# Patient Record
Sex: Female | Born: 1935 | Race: White | Hispanic: No | Smoking: Never smoker
Health system: Southern US, Community
[De-identification: ages and names within clinical notes are randomized; demographics above are authoritative.]

## PROBLEM LIST (undated history)

## (undated) DIAGNOSIS — I10 Essential (primary) hypertension: Secondary | ICD-10-CM

## (undated) DIAGNOSIS — E7800 Pure hypercholesterolemia, unspecified: Secondary | ICD-10-CM

## (undated) DIAGNOSIS — I4891 Unspecified atrial fibrillation: Secondary | ICD-10-CM

## (undated) DIAGNOSIS — R600 Localized edema: Secondary | ICD-10-CM

## (undated) DIAGNOSIS — F419 Anxiety disorder, unspecified: Secondary | ICD-10-CM

## (undated) DIAGNOSIS — R609 Edema, unspecified: Secondary | ICD-10-CM

## (undated) HISTORY — PX: CATARACT EXTRACTION W/ INTRAOCULAR LENS  IMPLANT, BILATERAL: SHX1307

## (undated) HISTORY — PX: ABDOMINAL HYSTERECTOMY: SHX81

## (undated) HISTORY — PX: CHOLECYSTECTOMY: SHX55

---

## 2002-01-28 ENCOUNTER — Encounter: Admission: RE | Admit: 2002-01-28 | Discharge: 2002-01-28 | Payer: Self-pay | Admitting: Family Medicine

## 2002-01-28 ENCOUNTER — Encounter: Payer: Self-pay | Admitting: Family Medicine

## 2003-03-21 ENCOUNTER — Ambulatory Visit (HOSPITAL_COMMUNITY): Admission: RE | Admit: 2003-03-21 | Discharge: 2003-03-21 | Payer: Self-pay | Admitting: Otolaryngology

## 2003-03-21 ENCOUNTER — Encounter: Payer: Self-pay | Admitting: Otolaryngology

## 2003-07-22 ENCOUNTER — Ambulatory Visit (HOSPITAL_COMMUNITY): Admission: RE | Admit: 2003-07-22 | Discharge: 2003-07-22 | Payer: Self-pay | Admitting: Gastroenterology

## 2003-07-22 ENCOUNTER — Encounter (INDEPENDENT_AMBULATORY_CARE_PROVIDER_SITE_OTHER): Payer: Self-pay | Admitting: Specialist

## 2003-09-09 ENCOUNTER — Ambulatory Visit (HOSPITAL_COMMUNITY): Admission: RE | Admit: 2003-09-09 | Discharge: 2003-09-09 | Payer: Self-pay | Admitting: Gastroenterology

## 2004-06-29 ENCOUNTER — Encounter: Admission: RE | Admit: 2004-06-29 | Discharge: 2004-06-29 | Payer: Self-pay | Admitting: Family Medicine

## 2007-12-04 ENCOUNTER — Encounter: Admission: RE | Admit: 2007-12-04 | Discharge: 2007-12-04 | Payer: Self-pay | Admitting: Family Medicine

## 2008-06-09 ENCOUNTER — Encounter: Admission: RE | Admit: 2008-06-09 | Discharge: 2008-06-09 | Payer: Self-pay | Admitting: Family Medicine

## 2010-11-24 NOTE — Op Note (Signed)
NAME:  Lynn Alvarado, Lynn Alvarado                          ACCOUNT NO.:  [id number]   MEDICAL RECORD NO.:  [id number]                   PATIENT TYPE:  AMB   LOCATION:  ENDO                                 FACILITY:  [facility]   PHYSICIAN:  Danise Edge, M.D.                DATE OF BIRTH:  1924-12-07   DATE OF PROCEDURE:  07/22/2003  DATE OF DISCHARGE:                                 OPERATIVE REPORT   PROCEDURE:  Incomplete colonoscopy.   PROCEDURE INDICATION:  [name] is a 77 year old female, born  July 29, 1924.  [name] was scheduled to undergo her first screening  colonoscopy with polyp to prevent colon cancer.  Due to extensive colonic  diverticulosis, sigmoid colonic loop formation, and the inability of Ms.  Krygier to retain the insufflated air, I failed to complete a colonoscopy  and, in fact, could only advance the endoscope to approximately 30 cm from  the anal verge.   ENDOSCOPIST:  Charolett Bumpers, M.D.   PREMEDICATION:  1. Demerol 50 mg.  2. Versed 3 mg.   DESCRIPTION OF PROCEDURE:  After obtaining informed consent, [name] was  placed in the left lateral decubitus position.  I administered intravenous  Demerol and intravenous Versed to achieve conscious sedation for the  procedure.  The patient's blood pressure, oxygen saturation, and cardiac  rhythm were monitored throughout the procedure and documented in the medical  record.   Anal inspection was normal.  Digital rectal exam was normal.  The Olympus  adjustable pediatric colonoscope was introduced into the rectum and advanced  to only 30 cm from the anal verge.  A complete colonoscopy could not be  performed due to colonic diverticulosis, colonic loop formation, and the  inability of [name] to retain the insufflated air, making  identification of a lumen impossible.  From the proximal rectum, a 2 mm  sessile polyp was removed with the cold biopsy forceps.  [name] does  have sigmoid colonic  diverticulosis.  The rectal mucosa and distal sigmoid  mucosa otherwise appeared normal.   ASSESSMENT:  1. Incomplete colonoscopy.  2. Left colonic diverticulosis.  3. A diminutive polyp was removed from the proximal rectum.   RECOMMENDATIONS:  I will schedule [name] for an air contrast barium  enema at [facility] in 3-6 weeks.                                               Danise Edge, M.D.    MJ/MEDQ  D:  07/22/2003  T:  07/22/2003  Job:  [id number]   cc:   Donia Guiles, M.D.  [address] [place]  [place] [address]  Fax: [phone]

## 2011-07-31 DIAGNOSIS — E782 Mixed hyperlipidemia: Secondary | ICD-10-CM | POA: Diagnosis not present

## 2011-07-31 DIAGNOSIS — R7301 Impaired fasting glucose: Secondary | ICD-10-CM | POA: Diagnosis not present

## 2011-07-31 DIAGNOSIS — E039 Hypothyroidism, unspecified: Secondary | ICD-10-CM | POA: Diagnosis not present

## 2011-07-31 DIAGNOSIS — F329 Major depressive disorder, single episode, unspecified: Secondary | ICD-10-CM | POA: Diagnosis not present

## 2011-07-31 DIAGNOSIS — I10 Essential (primary) hypertension: Secondary | ICD-10-CM | POA: Diagnosis not present

## 2011-07-31 DIAGNOSIS — E559 Vitamin D deficiency, unspecified: Secondary | ICD-10-CM | POA: Diagnosis not present

## 2011-07-31 DIAGNOSIS — R42 Dizziness and giddiness: Secondary | ICD-10-CM | POA: Diagnosis not present

## 2011-10-09 DIAGNOSIS — H353190 Nonexudative age-related macular degeneration, unspecified eye, stage unspecified: Secondary | ICD-10-CM | POA: Diagnosis not present

## 2011-10-09 DIAGNOSIS — H04129 Dry eye syndrome of unspecified lacrimal gland: Secondary | ICD-10-CM | POA: Diagnosis not present

## 2011-10-09 DIAGNOSIS — H35369 Drusen (degenerative) of macula, unspecified eye: Secondary | ICD-10-CM | POA: Diagnosis not present

## 2012-01-28 DIAGNOSIS — Z0000 Encounter for general adult medical examination without abnormal findings: Secondary | ICD-10-CM | POA: Diagnosis not present

## 2012-01-28 DIAGNOSIS — I10 Essential (primary) hypertension: Secondary | ICD-10-CM | POA: Diagnosis not present

## 2012-01-28 DIAGNOSIS — E559 Vitamin D deficiency, unspecified: Secondary | ICD-10-CM | POA: Diagnosis not present

## 2012-01-28 DIAGNOSIS — R7301 Impaired fasting glucose: Secondary | ICD-10-CM | POA: Diagnosis not present

## 2012-01-28 DIAGNOSIS — E782 Mixed hyperlipidemia: Secondary | ICD-10-CM | POA: Diagnosis not present

## 2012-01-28 DIAGNOSIS — F329 Major depressive disorder, single episode, unspecified: Secondary | ICD-10-CM | POA: Diagnosis not present

## 2012-01-28 DIAGNOSIS — R42 Dizziness and giddiness: Secondary | ICD-10-CM | POA: Diagnosis not present

## 2012-01-28 DIAGNOSIS — E039 Hypothyroidism, unspecified: Secondary | ICD-10-CM | POA: Diagnosis not present

## 2012-02-22 DIAGNOSIS — H3530 Unspecified macular degeneration: Secondary | ICD-10-CM | POA: Diagnosis not present

## 2012-02-22 DIAGNOSIS — H04129 Dry eye syndrome of unspecified lacrimal gland: Secondary | ICD-10-CM | POA: Diagnosis not present

## 2012-02-22 DIAGNOSIS — Z961 Presence of intraocular lens: Secondary | ICD-10-CM | POA: Diagnosis not present

## 2012-03-03 DIAGNOSIS — M899 Disorder of bone, unspecified: Secondary | ICD-10-CM | POA: Diagnosis not present

## 2012-04-15 DIAGNOSIS — H35369 Drusen (degenerative) of macula, unspecified eye: Secondary | ICD-10-CM | POA: Diagnosis not present

## 2012-04-15 DIAGNOSIS — H43819 Vitreous degeneration, unspecified eye: Secondary | ICD-10-CM | POA: Diagnosis not present

## 2012-04-15 DIAGNOSIS — H353190 Nonexudative age-related macular degeneration, unspecified eye, stage unspecified: Secondary | ICD-10-CM | POA: Diagnosis not present

## 2012-04-28 DIAGNOSIS — Z1231 Encounter for screening mammogram for malignant neoplasm of breast: Secondary | ICD-10-CM | POA: Diagnosis not present

## 2012-05-26 DIAGNOSIS — Z23 Encounter for immunization: Secondary | ICD-10-CM | POA: Diagnosis not present

## 2012-07-30 DIAGNOSIS — I10 Essential (primary) hypertension: Secondary | ICD-10-CM | POA: Diagnosis not present

## 2012-07-30 DIAGNOSIS — R42 Dizziness and giddiness: Secondary | ICD-10-CM | POA: Diagnosis not present

## 2012-07-30 DIAGNOSIS — E782 Mixed hyperlipidemia: Secondary | ICD-10-CM | POA: Diagnosis not present

## 2012-07-30 DIAGNOSIS — E039 Hypothyroidism, unspecified: Secondary | ICD-10-CM | POA: Diagnosis not present

## 2012-07-30 DIAGNOSIS — R7301 Impaired fasting glucose: Secondary | ICD-10-CM | POA: Diagnosis not present

## 2012-07-30 DIAGNOSIS — F329 Major depressive disorder, single episode, unspecified: Secondary | ICD-10-CM | POA: Diagnosis not present

## 2013-01-27 DIAGNOSIS — R42 Dizziness and giddiness: Secondary | ICD-10-CM | POA: Diagnosis not present

## 2013-01-27 DIAGNOSIS — E782 Mixed hyperlipidemia: Secondary | ICD-10-CM | POA: Diagnosis not present

## 2013-01-27 DIAGNOSIS — N318 Other neuromuscular dysfunction of bladder: Secondary | ICD-10-CM | POA: Diagnosis not present

## 2013-01-27 DIAGNOSIS — I10 Essential (primary) hypertension: Secondary | ICD-10-CM | POA: Diagnosis not present

## 2013-01-27 DIAGNOSIS — E039 Hypothyroidism, unspecified: Secondary | ICD-10-CM | POA: Diagnosis not present

## 2013-01-27 DIAGNOSIS — F329 Major depressive disorder, single episode, unspecified: Secondary | ICD-10-CM | POA: Diagnosis not present

## 2013-01-27 DIAGNOSIS — R7301 Impaired fasting glucose: Secondary | ICD-10-CM | POA: Diagnosis not present

## 2013-02-02 DIAGNOSIS — H43819 Vitreous degeneration, unspecified eye: Secondary | ICD-10-CM | POA: Diagnosis not present

## 2013-02-02 DIAGNOSIS — H35369 Drusen (degenerative) of macula, unspecified eye: Secondary | ICD-10-CM | POA: Diagnosis not present

## 2013-02-02 DIAGNOSIS — H353190 Nonexudative age-related macular degeneration, unspecified eye, stage unspecified: Secondary | ICD-10-CM | POA: Diagnosis not present

## 2013-02-02 DIAGNOSIS — T1500XA Foreign body in cornea, unspecified eye, initial encounter: Secondary | ICD-10-CM | POA: Diagnosis not present

## 2013-04-15 DIAGNOSIS — Z23 Encounter for immunization: Secondary | ICD-10-CM | POA: Diagnosis not present

## 2013-04-29 DIAGNOSIS — Z1231 Encounter for screening mammogram for malignant neoplasm of breast: Secondary | ICD-10-CM | POA: Diagnosis not present

## 2013-07-17 DIAGNOSIS — M26609 Unspecified temporomandibular joint disorder, unspecified side: Secondary | ICD-10-CM | POA: Diagnosis not present

## 2013-07-17 DIAGNOSIS — J069 Acute upper respiratory infection, unspecified: Secondary | ICD-10-CM | POA: Diagnosis not present

## 2013-08-04 ENCOUNTER — Ambulatory Visit
Admission: RE | Admit: 2013-08-04 | Discharge: 2013-08-04 | Disposition: A | Discharge status: Home / Self Care | Payer: Medicare Other | Source: Ambulatory Visit | Attending: Family Medicine | Admitting: Family Medicine

## 2013-08-04 ENCOUNTER — Other Ambulatory Visit: Payer: Self-pay | Admitting: Family Medicine

## 2013-08-04 DIAGNOSIS — E039 Hypothyroidism, unspecified: Secondary | ICD-10-CM | POA: Diagnosis not present

## 2013-08-04 DIAGNOSIS — R42 Dizziness and giddiness: Secondary | ICD-10-CM | POA: Diagnosis not present

## 2013-08-04 DIAGNOSIS — R042 Hemoptysis: Secondary | ICD-10-CM | POA: Diagnosis not present

## 2013-08-04 DIAGNOSIS — Z23 Encounter for immunization: Secondary | ICD-10-CM | POA: Diagnosis not present

## 2013-08-04 DIAGNOSIS — R059 Cough, unspecified: Secondary | ICD-10-CM | POA: Diagnosis not present

## 2013-08-04 DIAGNOSIS — N318 Other neuromuscular dysfunction of bladder: Secondary | ICD-10-CM | POA: Diagnosis not present

## 2013-08-04 DIAGNOSIS — R7301 Impaired fasting glucose: Secondary | ICD-10-CM | POA: Diagnosis not present

## 2013-08-04 DIAGNOSIS — R05 Cough: Secondary | ICD-10-CM | POA: Diagnosis not present

## 2013-08-04 DIAGNOSIS — I10 Essential (primary) hypertension: Secondary | ICD-10-CM | POA: Diagnosis not present

## 2013-08-04 DIAGNOSIS — F329 Major depressive disorder, single episode, unspecified: Secondary | ICD-10-CM | POA: Diagnosis not present

## 2013-08-04 DIAGNOSIS — E782 Mixed hyperlipidemia: Secondary | ICD-10-CM | POA: Diagnosis not present

## 2014-01-29 DIAGNOSIS — R42 Dizziness and giddiness: Secondary | ICD-10-CM | POA: Diagnosis not present

## 2014-01-29 DIAGNOSIS — E669 Obesity, unspecified: Secondary | ICD-10-CM | POA: Diagnosis not present

## 2014-01-29 DIAGNOSIS — E039 Hypothyroidism, unspecified: Secondary | ICD-10-CM | POA: Diagnosis not present

## 2014-01-29 DIAGNOSIS — F329 Major depressive disorder, single episode, unspecified: Secondary | ICD-10-CM | POA: Diagnosis not present

## 2014-01-29 DIAGNOSIS — R7301 Impaired fasting glucose: Secondary | ICD-10-CM | POA: Diagnosis not present

## 2014-01-29 DIAGNOSIS — E782 Mixed hyperlipidemia: Secondary | ICD-10-CM | POA: Diagnosis not present

## 2014-01-29 DIAGNOSIS — Z6832 Body mass index (BMI) 32.0-32.9, adult: Secondary | ICD-10-CM | POA: Diagnosis not present

## 2014-01-29 DIAGNOSIS — I10 Essential (primary) hypertension: Secondary | ICD-10-CM | POA: Diagnosis not present

## 2014-02-01 DIAGNOSIS — H35369 Drusen (degenerative) of macula, unspecified eye: Secondary | ICD-10-CM | POA: Diagnosis not present

## 2014-02-01 DIAGNOSIS — H353190 Nonexudative age-related macular degeneration, unspecified eye, stage unspecified: Secondary | ICD-10-CM | POA: Diagnosis not present

## 2014-02-01 DIAGNOSIS — H43819 Vitreous degeneration, unspecified eye: Secondary | ICD-10-CM | POA: Diagnosis not present

## 2014-02-23 DIAGNOSIS — Z961 Presence of intraocular lens: Secondary | ICD-10-CM | POA: Diagnosis not present

## 2014-02-23 DIAGNOSIS — H353190 Nonexudative age-related macular degeneration, unspecified eye, stage unspecified: Secondary | ICD-10-CM | POA: Diagnosis not present

## 2014-02-23 DIAGNOSIS — H04129 Dry eye syndrome of unspecified lacrimal gland: Secondary | ICD-10-CM | POA: Diagnosis not present

## 2014-04-30 DIAGNOSIS — Z1231 Encounter for screening mammogram for malignant neoplasm of breast: Secondary | ICD-10-CM | POA: Diagnosis not present

## 2014-08-03 DIAGNOSIS — Z0000 Encounter for general adult medical examination without abnormal findings: Secondary | ICD-10-CM | POA: Diagnosis not present

## 2014-08-03 DIAGNOSIS — I10 Essential (primary) hypertension: Secondary | ICD-10-CM | POA: Diagnosis not present

## 2014-08-03 DIAGNOSIS — R42 Dizziness and giddiness: Secondary | ICD-10-CM | POA: Diagnosis not present

## 2014-08-03 DIAGNOSIS — E039 Hypothyroidism, unspecified: Secondary | ICD-10-CM | POA: Diagnosis not present

## 2014-08-03 DIAGNOSIS — E782 Mixed hyperlipidemia: Secondary | ICD-10-CM | POA: Diagnosis not present

## 2014-08-03 DIAGNOSIS — E559 Vitamin D deficiency, unspecified: Secondary | ICD-10-CM | POA: Diagnosis not present

## 2014-08-03 DIAGNOSIS — M150 Primary generalized (osteo)arthritis: Secondary | ICD-10-CM | POA: Diagnosis not present

## 2014-08-03 DIAGNOSIS — N3281 Overactive bladder: Secondary | ICD-10-CM | POA: Diagnosis not present

## 2014-08-03 DIAGNOSIS — F322 Major depressive disorder, single episode, severe without psychotic features: Secondary | ICD-10-CM | POA: Diagnosis not present

## 2014-08-03 DIAGNOSIS — R7301 Impaired fasting glucose: Secondary | ICD-10-CM | POA: Diagnosis not present

## 2014-08-03 DIAGNOSIS — F411 Generalized anxiety disorder: Secondary | ICD-10-CM | POA: Diagnosis not present

## 2014-08-03 DIAGNOSIS — J301 Allergic rhinitis due to pollen: Secondary | ICD-10-CM | POA: Diagnosis not present

## 2014-08-04 DIAGNOSIS — M799 Soft tissue disorder, unspecified: Secondary | ICD-10-CM | POA: Diagnosis not present

## 2014-08-04 DIAGNOSIS — I10 Essential (primary) hypertension: Secondary | ICD-10-CM | POA: Diagnosis not present

## 2014-08-11 DIAGNOSIS — M25561 Pain in right knee: Secondary | ICD-10-CM | POA: Diagnosis not present

## 2014-08-18 DIAGNOSIS — S83206D Unspecified tear of unspecified meniscus, current injury, right knee, subsequent encounter: Secondary | ICD-10-CM | POA: Diagnosis not present

## 2014-08-18 DIAGNOSIS — M1711 Unilateral primary osteoarthritis, right knee: Secondary | ICD-10-CM | POA: Diagnosis not present

## 2014-09-06 DIAGNOSIS — M8580 Other specified disorders of bone density and structure, unspecified site: Secondary | ICD-10-CM | POA: Diagnosis not present

## 2014-09-06 DIAGNOSIS — M859 Disorder of bone density and structure, unspecified: Secondary | ICD-10-CM | POA: Diagnosis not present

## 2014-11-17 DIAGNOSIS — Z23 Encounter for immunization: Secondary | ICD-10-CM | POA: Diagnosis not present

## 2015-02-04 DIAGNOSIS — R7301 Impaired fasting glucose: Secondary | ICD-10-CM | POA: Diagnosis not present

## 2015-02-04 DIAGNOSIS — I10 Essential (primary) hypertension: Secondary | ICD-10-CM | POA: Diagnosis not present

## 2015-02-04 DIAGNOSIS — R42 Dizziness and giddiness: Secondary | ICD-10-CM | POA: Diagnosis not present

## 2015-02-04 DIAGNOSIS — E782 Mixed hyperlipidemia: Secondary | ICD-10-CM | POA: Diagnosis not present

## 2015-02-04 DIAGNOSIS — E039 Hypothyroidism, unspecified: Secondary | ICD-10-CM | POA: Diagnosis not present

## 2015-02-04 DIAGNOSIS — F322 Major depressive disorder, single episode, severe without psychotic features: Secondary | ICD-10-CM | POA: Diagnosis not present

## 2015-03-01 DIAGNOSIS — Z961 Presence of intraocular lens: Secondary | ICD-10-CM | POA: Diagnosis not present

## 2015-03-01 DIAGNOSIS — H04123 Dry eye syndrome of bilateral lacrimal glands: Secondary | ICD-10-CM | POA: Diagnosis not present

## 2015-03-01 DIAGNOSIS — H3531 Nonexudative age-related macular degeneration: Secondary | ICD-10-CM | POA: Diagnosis not present

## 2015-05-06 DIAGNOSIS — Z23 Encounter for immunization: Secondary | ICD-10-CM | POA: Diagnosis not present

## 2015-09-12 DIAGNOSIS — E559 Vitamin D deficiency, unspecified: Secondary | ICD-10-CM | POA: Diagnosis not present

## 2015-09-12 DIAGNOSIS — E782 Mixed hyperlipidemia: Secondary | ICD-10-CM | POA: Diagnosis not present

## 2015-09-12 DIAGNOSIS — Z0000 Encounter for general adult medical examination without abnormal findings: Secondary | ICD-10-CM | POA: Diagnosis not present

## 2015-09-12 DIAGNOSIS — M8580 Other specified disorders of bone density and structure, unspecified site: Secondary | ICD-10-CM | POA: Diagnosis not present

## 2015-09-12 DIAGNOSIS — R42 Dizziness and giddiness: Secondary | ICD-10-CM | POA: Diagnosis not present

## 2015-09-12 DIAGNOSIS — M150 Primary generalized (osteo)arthritis: Secondary | ICD-10-CM | POA: Diagnosis not present

## 2015-09-12 DIAGNOSIS — F411 Generalized anxiety disorder: Secondary | ICD-10-CM | POA: Diagnosis not present

## 2015-09-12 DIAGNOSIS — I10 Essential (primary) hypertension: Secondary | ICD-10-CM | POA: Diagnosis not present

## 2015-09-12 DIAGNOSIS — E039 Hypothyroidism, unspecified: Secondary | ICD-10-CM | POA: Diagnosis not present

## 2015-09-12 DIAGNOSIS — N3281 Overactive bladder: Secondary | ICD-10-CM | POA: Diagnosis not present

## 2015-09-12 DIAGNOSIS — R7301 Impaired fasting glucose: Secondary | ICD-10-CM | POA: Diagnosis not present

## 2015-09-12 DIAGNOSIS — F322 Major depressive disorder, single episode, severe without psychotic features: Secondary | ICD-10-CM | POA: Diagnosis not present

## 2016-02-02 DIAGNOSIS — H04129 Dry eye syndrome of unspecified lacrimal gland: Secondary | ICD-10-CM | POA: Diagnosis not present

## 2016-02-02 DIAGNOSIS — H35363 Drusen (degenerative) of macula, bilateral: Secondary | ICD-10-CM | POA: Diagnosis not present

## 2016-02-02 DIAGNOSIS — H43813 Vitreous degeneration, bilateral: Secondary | ICD-10-CM | POA: Diagnosis not present

## 2016-02-02 DIAGNOSIS — H353131 Nonexudative age-related macular degeneration, bilateral, early dry stage: Secondary | ICD-10-CM | POA: Diagnosis not present

## 2016-03-28 DIAGNOSIS — Z23 Encounter for immunization: Secondary | ICD-10-CM | POA: Diagnosis not present

## 2016-04-13 DIAGNOSIS — F322 Major depressive disorder, single episode, severe without psychotic features: Secondary | ICD-10-CM | POA: Diagnosis not present

## 2016-04-13 DIAGNOSIS — I10 Essential (primary) hypertension: Secondary | ICD-10-CM | POA: Diagnosis not present

## 2016-04-13 DIAGNOSIS — R7301 Impaired fasting glucose: Secondary | ICD-10-CM | POA: Diagnosis not present

## 2016-04-13 DIAGNOSIS — F411 Generalized anxiety disorder: Secondary | ICD-10-CM | POA: Diagnosis not present

## 2016-04-13 DIAGNOSIS — E039 Hypothyroidism, unspecified: Secondary | ICD-10-CM | POA: Diagnosis not present

## 2016-04-13 DIAGNOSIS — E782 Mixed hyperlipidemia: Secondary | ICD-10-CM | POA: Diagnosis not present

## 2016-04-13 DIAGNOSIS — N3281 Overactive bladder: Secondary | ICD-10-CM | POA: Diagnosis not present

## 2016-06-07 ENCOUNTER — Other Ambulatory Visit: Payer: Self-pay | Admitting: Family Medicine

## 2016-06-07 ENCOUNTER — Ambulatory Visit
Admission: RE | Admit: 2016-06-07 | Discharge: 2016-06-07 | Disposition: A | Discharge status: Home / Self Care | Payer: Medicare Other | Source: Ambulatory Visit | Attending: Family Medicine | Admitting: Family Medicine

## 2016-06-07 DIAGNOSIS — R042 Hemoptysis: Secondary | ICD-10-CM | POA: Diagnosis not present

## 2016-11-08 DIAGNOSIS — N3281 Overactive bladder: Secondary | ICD-10-CM | POA: Diagnosis not present

## 2016-11-08 DIAGNOSIS — F411 Generalized anxiety disorder: Secondary | ICD-10-CM | POA: Diagnosis not present

## 2016-11-08 DIAGNOSIS — M8580 Other specified disorders of bone density and structure, unspecified site: Secondary | ICD-10-CM | POA: Diagnosis not present

## 2016-11-08 DIAGNOSIS — E039 Hypothyroidism, unspecified: Secondary | ICD-10-CM | POA: Diagnosis not present

## 2016-11-08 DIAGNOSIS — R7301 Impaired fasting glucose: Secondary | ICD-10-CM | POA: Diagnosis not present

## 2016-11-08 DIAGNOSIS — E782 Mixed hyperlipidemia: Secondary | ICD-10-CM | POA: Diagnosis not present

## 2016-11-08 DIAGNOSIS — Z0000 Encounter for general adult medical examination without abnormal findings: Secondary | ICD-10-CM | POA: Diagnosis not present

## 2016-11-08 DIAGNOSIS — I10 Essential (primary) hypertension: Secondary | ICD-10-CM | POA: Diagnosis not present

## 2016-11-08 DIAGNOSIS — F322 Major depressive disorder, single episode, severe without psychotic features: Secondary | ICD-10-CM | POA: Diagnosis not present

## 2016-11-08 DIAGNOSIS — M150 Primary generalized (osteo)arthritis: Secondary | ICD-10-CM | POA: Diagnosis not present

## 2016-11-08 DIAGNOSIS — E559 Vitamin D deficiency, unspecified: Secondary | ICD-10-CM | POA: Diagnosis not present

## 2016-11-08 DIAGNOSIS — J301 Allergic rhinitis due to pollen: Secondary | ICD-10-CM | POA: Diagnosis not present

## 2016-12-19 DIAGNOSIS — M8588 Other specified disorders of bone density and structure, other site: Secondary | ICD-10-CM | POA: Diagnosis not present

## 2017-03-15 DIAGNOSIS — H353131 Nonexudative age-related macular degeneration, bilateral, early dry stage: Secondary | ICD-10-CM | POA: Diagnosis not present

## 2017-04-08 DIAGNOSIS — Z23 Encounter for immunization: Secondary | ICD-10-CM | POA: Diagnosis not present

## 2017-05-27 DIAGNOSIS — E039 Hypothyroidism, unspecified: Secondary | ICD-10-CM | POA: Diagnosis not present

## 2017-05-27 DIAGNOSIS — F322 Major depressive disorder, single episode, severe without psychotic features: Secondary | ICD-10-CM | POA: Diagnosis not present

## 2017-05-27 DIAGNOSIS — R7301 Impaired fasting glucose: Secondary | ICD-10-CM | POA: Diagnosis not present

## 2017-05-27 DIAGNOSIS — I10 Essential (primary) hypertension: Secondary | ICD-10-CM | POA: Diagnosis not present

## 2017-05-27 DIAGNOSIS — E782 Mixed hyperlipidemia: Secondary | ICD-10-CM | POA: Diagnosis not present

## 2017-12-04 DIAGNOSIS — E039 Hypothyroidism, unspecified: Secondary | ICD-10-CM | POA: Diagnosis not present

## 2017-12-04 DIAGNOSIS — M8580 Other specified disorders of bone density and structure, unspecified site: Secondary | ICD-10-CM | POA: Diagnosis not present

## 2017-12-04 DIAGNOSIS — F411 Generalized anxiety disorder: Secondary | ICD-10-CM | POA: Diagnosis not present

## 2017-12-04 DIAGNOSIS — M150 Primary generalized (osteo)arthritis: Secondary | ICD-10-CM | POA: Diagnosis not present

## 2017-12-04 DIAGNOSIS — R7301 Impaired fasting glucose: Secondary | ICD-10-CM | POA: Diagnosis not present

## 2017-12-04 DIAGNOSIS — N3281 Overactive bladder: Secondary | ICD-10-CM | POA: Diagnosis not present

## 2017-12-04 DIAGNOSIS — J301 Allergic rhinitis due to pollen: Secondary | ICD-10-CM | POA: Diagnosis not present

## 2017-12-04 DIAGNOSIS — Z0000 Encounter for general adult medical examination without abnormal findings: Secondary | ICD-10-CM | POA: Diagnosis not present

## 2017-12-04 DIAGNOSIS — F322 Major depressive disorder, single episode, severe without psychotic features: Secondary | ICD-10-CM | POA: Diagnosis not present

## 2017-12-04 DIAGNOSIS — E559 Vitamin D deficiency, unspecified: Secondary | ICD-10-CM | POA: Diagnosis not present

## 2017-12-04 DIAGNOSIS — I10 Essential (primary) hypertension: Secondary | ICD-10-CM | POA: Diagnosis not present

## 2017-12-04 DIAGNOSIS — E782 Mixed hyperlipidemia: Secondary | ICD-10-CM | POA: Diagnosis not present

## 2018-01-31 DIAGNOSIS — E559 Vitamin D deficiency, unspecified: Secondary | ICD-10-CM | POA: Diagnosis not present

## 2018-03-20 ENCOUNTER — Emergency Department (HOSPITAL_COMMUNITY): Payer: Medicare Other

## 2018-03-20 ENCOUNTER — Encounter (HOSPITAL_COMMUNITY): Payer: Self-pay

## 2018-03-20 ENCOUNTER — Other Ambulatory Visit: Payer: Self-pay

## 2018-03-20 ENCOUNTER — Observation Stay (HOSPITAL_COMMUNITY)
Admission: EM | Admit: 2018-03-20 | Discharge: 2018-03-21 | Disposition: A | Discharge status: Home / Self Care | Payer: Medicare Other | Attending: Internal Medicine | Admitting: Internal Medicine

## 2018-03-20 DIAGNOSIS — S8002XA Contusion of left knee, initial encounter: Secondary | ICD-10-CM | POA: Diagnosis not present

## 2018-03-20 DIAGNOSIS — I10 Essential (primary) hypertension: Secondary | ICD-10-CM | POA: Diagnosis not present

## 2018-03-20 DIAGNOSIS — I700 Atherosclerosis of aorta: Secondary | ICD-10-CM | POA: Insufficient documentation

## 2018-03-20 DIAGNOSIS — S27892A Contusion of other specified intrathoracic organs, initial encounter: Secondary | ICD-10-CM

## 2018-03-20 DIAGNOSIS — K838 Other specified diseases of biliary tract: Secondary | ICD-10-CM | POA: Diagnosis not present

## 2018-03-20 DIAGNOSIS — S2220XA Unspecified fracture of sternum, initial encounter for closed fracture: Secondary | ICD-10-CM

## 2018-03-20 DIAGNOSIS — Z9049 Acquired absence of other specified parts of digestive tract: Secondary | ICD-10-CM | POA: Insufficient documentation

## 2018-03-20 DIAGNOSIS — Z79890 Hormone replacement therapy (postmenopausal): Secondary | ICD-10-CM | POA: Insufficient documentation

## 2018-03-20 DIAGNOSIS — I491 Atrial premature depolarization: Secondary | ICD-10-CM | POA: Diagnosis not present

## 2018-03-20 DIAGNOSIS — D329 Benign neoplasm of meninges, unspecified: Secondary | ICD-10-CM | POA: Insufficient documentation

## 2018-03-20 DIAGNOSIS — D696 Thrombocytopenia, unspecified: Secondary | ICD-10-CM | POA: Diagnosis not present

## 2018-03-20 DIAGNOSIS — I480 Paroxysmal atrial fibrillation: Principal | ICD-10-CM | POA: Insufficient documentation

## 2018-03-20 DIAGNOSIS — J90 Pleural effusion, not elsewhere classified: Secondary | ICD-10-CM | POA: Diagnosis not present

## 2018-03-20 DIAGNOSIS — S8991XA Unspecified injury of right lower leg, initial encounter: Secondary | ICD-10-CM | POA: Diagnosis not present

## 2018-03-20 DIAGNOSIS — S199XXA Unspecified injury of neck, initial encounter: Secondary | ICD-10-CM | POA: Diagnosis not present

## 2018-03-20 DIAGNOSIS — E039 Hypothyroidism, unspecified: Secondary | ICD-10-CM | POA: Diagnosis present

## 2018-03-20 DIAGNOSIS — Z8249 Family history of ischemic heart disease and other diseases of the circulatory system: Secondary | ICD-10-CM | POA: Insufficient documentation

## 2018-03-20 DIAGNOSIS — Z79899 Other long term (current) drug therapy: Secondary | ICD-10-CM | POA: Diagnosis not present

## 2018-03-20 DIAGNOSIS — Z7982 Long term (current) use of aspirin: Secondary | ICD-10-CM | POA: Insufficient documentation

## 2018-03-20 DIAGNOSIS — K5730 Diverticulosis of large intestine without perforation or abscess without bleeding: Secondary | ICD-10-CM | POA: Insufficient documentation

## 2018-03-20 DIAGNOSIS — I4891 Unspecified atrial fibrillation: Secondary | ICD-10-CM | POA: Diagnosis not present

## 2018-03-20 DIAGNOSIS — F419 Anxiety disorder, unspecified: Secondary | ICD-10-CM | POA: Diagnosis not present

## 2018-03-20 DIAGNOSIS — Z23 Encounter for immunization: Secondary | ICD-10-CM | POA: Diagnosis not present

## 2018-03-20 DIAGNOSIS — M11262 Other chondrocalcinosis, left knee: Secondary | ICD-10-CM | POA: Insufficient documentation

## 2018-03-20 DIAGNOSIS — S0990XA Unspecified injury of head, initial encounter: Secondary | ICD-10-CM | POA: Diagnosis not present

## 2018-03-20 DIAGNOSIS — R0789 Other chest pain: Secondary | ICD-10-CM | POA: Diagnosis not present

## 2018-03-20 DIAGNOSIS — I499 Cardiac arrhythmia, unspecified: Secondary | ICD-10-CM | POA: Diagnosis not present

## 2018-03-20 DIAGNOSIS — S3991XA Unspecified injury of abdomen, initial encounter: Secondary | ICD-10-CM | POA: Diagnosis not present

## 2018-03-20 DIAGNOSIS — J9811 Atelectasis: Secondary | ICD-10-CM | POA: Insufficient documentation

## 2018-03-20 DIAGNOSIS — M79662 Pain in left lower leg: Secondary | ICD-10-CM | POA: Diagnosis not present

## 2018-03-20 DIAGNOSIS — S8992XA Unspecified injury of left lower leg, initial encounter: Secondary | ICD-10-CM | POA: Diagnosis not present

## 2018-03-20 DIAGNOSIS — G319 Degenerative disease of nervous system, unspecified: Secondary | ICD-10-CM | POA: Diagnosis not present

## 2018-03-20 DIAGNOSIS — I6782 Cerebral ischemia: Secondary | ICD-10-CM | POA: Insufficient documentation

## 2018-03-20 DIAGNOSIS — E041 Nontoxic single thyroid nodule: Secondary | ICD-10-CM | POA: Insufficient documentation

## 2018-03-20 DIAGNOSIS — S299XXA Unspecified injury of thorax, initial encounter: Secondary | ICD-10-CM | POA: Diagnosis not present

## 2018-03-20 DIAGNOSIS — R079 Chest pain, unspecified: Secondary | ICD-10-CM | POA: Diagnosis not present

## 2018-03-20 DIAGNOSIS — S20212A Contusion of left front wall of thorax, initial encounter: Secondary | ICD-10-CM | POA: Diagnosis not present

## 2018-03-20 DIAGNOSIS — M25562 Pain in left knee: Secondary | ICD-10-CM | POA: Diagnosis not present

## 2018-03-20 HISTORY — DX: Essential (primary) hypertension: I10

## 2018-03-20 HISTORY — DX: Localized edema: R60.0

## 2018-03-20 HISTORY — DX: Unspecified atrial fibrillation: I48.91

## 2018-03-20 HISTORY — DX: Edema, unspecified: R60.9

## 2018-03-20 HISTORY — DX: Pure hypercholesterolemia, unspecified: E78.00

## 2018-03-20 HISTORY — DX: Anxiety disorder, unspecified: F41.9

## 2018-03-20 LAB — COMPREHENSIVE METABOLIC PANEL
ALK PHOS: 81 U/L (ref 38–126)
ALK PHOS: 91 U/L (ref 38–126)
ALT: 30 U/L (ref 0–44)
ALT: 30 U/L (ref 0–44)
AST: 40 U/L (ref 15–41)
AST: 42 U/L — ABNORMAL HIGH (ref 15–41)
Albumin: 3.4 g/dL — ABNORMAL LOW (ref 3.5–5.0)
Albumin: 3.6 g/dL (ref 3.5–5.0)
Anion gap: 11 (ref 5–15)
Anion gap: 7 (ref 5–15)
BILIRUBIN TOTAL: 2.4 mg/dL — AB (ref 0.3–1.2)
BILIRUBIN TOTAL: 2.4 mg/dL — AB (ref 0.3–1.2)
BUN: 22 mg/dL (ref 8–23)
BUN: 22 mg/dL (ref 8–23)
CALCIUM: 8.7 mg/dL — AB (ref 8.9–10.3)
CALCIUM: 9.4 mg/dL (ref 8.9–10.3)
CHLORIDE: 103 mmol/L (ref 98–111)
CO2: 25 mmol/L (ref 22–32)
CO2: 30 mmol/L (ref 22–32)
CREATININE: 0.81 mg/dL (ref 0.44–1.00)
CREATININE: 0.81 mg/dL (ref 0.44–1.00)
Chloride: 104 mmol/L (ref 98–111)
GFR calc Af Amer: >60 mL/min (ref >60)
GFR calc Af Amer: >60 mL/min (ref >60)
GFR calc non Af Amer: >60 mL/min (ref >60)
GLUCOSE: 142 mg/dL — AB (ref 70–99)
Glucose, Bld: 149 mg/dL — ABNORMAL HIGH (ref 70–99)
Potassium: 4 mmol/L (ref 3.5–5.1)
Potassium: 4.2 mmol/L (ref 3.5–5.1)
Sodium: 140 mmol/L (ref 135–145)
Sodium: 140 mmol/L (ref 135–145)
TOTAL PROTEIN: 5.9 g/dL — AB (ref 6.5–8.1)
Total Protein: 6.6 g/dL (ref 6.5–8.1)

## 2018-03-20 LAB — CBC WITH DIFFERENTIAL/PLATELET
Abs Immature Granulocytes: 0.1 10*3/uL (ref 0.0–0.1)
BASOS PCT: 0 %
Basophils Absolute: 0 10*3/uL (ref 0.0–0.1)
EOS ABS: 0 10*3/uL (ref 0.0–0.7)
EOS PCT: 0 %
HCT: 48.6 % — ABNORMAL HIGH (ref 36.0–46.0)
Hemoglobin: 15.9 g/dL — ABNORMAL HIGH (ref 12.0–15.0)
IMMATURE GRANULOCYTES: 1 %
Lymphocytes Relative: 10 %
Lymphs Abs: 1.3 10*3/uL (ref 0.7–4.0)
MCH: 31.7 pg (ref 26.0–34.0)
MCHC: 32.7 g/dL (ref 30.0–36.0)
MCV: 97 fL (ref 78.0–100.0)
MONOS PCT: 12 %
Monocytes Absolute: 1.6 10*3/uL — ABNORMAL HIGH (ref 0.1–1.0)
Neutro Abs: 9.7 10*3/uL — ABNORMAL HIGH (ref 1.7–7.7)
Neutrophils Relative %: 77 %
PLATELETS: 125 10*3/uL — AB (ref 150–400)
RBC: 5.01 MIL/uL (ref 3.87–5.11)
RDW: 13.2 % (ref 11.5–15.5)
WBC: 12.6 10*3/uL — AB (ref 4.0–10.5)

## 2018-03-20 LAB — LIPASE, BLOOD
Lipase: 27 U/L (ref 11–51)
Lipase: 27 U/L (ref 11–51)

## 2018-03-20 LAB — TYPE AND SCREEN
ABO/RH(D): A POS
Antibody Screen: NEGATIVE

## 2018-03-20 LAB — ABO/RH: ABO/RH(D): A POS

## 2018-03-20 LAB — TROPONIN I: Troponin I: <0.03 ng/mL (ref <0.03)

## 2018-03-20 MED ORDER — LORAZEPAM 0.5 MG PO TABS: 0.5000 mg | ORAL_TABLET | Freq: Two times a day (BID) | ORAL | Status: DC | PRN

## 2018-03-20 MED ORDER — IOPAMIDOL (ISOVUE-370) INJECTION 76%
100.0000 mL | Freq: Once | INTRAVENOUS | Status: AC | PRN
  Administered 2018-03-20: 100 mL via INTRAVENOUS

## 2018-03-20 MED ORDER — OXYBUTYNIN CHLORIDE ER 5 MG PO TB24
5.0000 mg | ORAL_TABLET | Freq: Every day | ORAL | Status: DC
  Administered 2018-03-21: 5 mg via ORAL
  Filled 2018-03-20: qty 1

## 2018-03-20 MED ORDER — ONDANSETRON HCL 4 MG PO TABS: 4.0000 mg | ORAL_TABLET | Freq: Four times a day (QID) | ORAL | Status: DC | PRN

## 2018-03-20 MED ORDER — ACETAMINOPHEN 650 MG RE SUPP: 650.0000 mg | Freq: Four times a day (QID) | RECTAL | Status: DC | PRN

## 2018-03-20 MED ORDER — METOPROLOL TARTRATE 5 MG/5ML IV SOLN
2.5000 mg | Freq: Once | INTRAVENOUS | Status: AC
  Administered 2018-03-20: 2.5 mg via INTRAVENOUS
  Filled 2018-03-20: qty 5

## 2018-03-20 MED ORDER — IOPAMIDOL (ISOVUE-370) INJECTION 76%
INTRAVENOUS | Status: AC
  Filled 2018-03-20: qty 100

## 2018-03-20 MED ORDER — MORPHINE SULFATE (PF) 2 MG/ML IV SOLN
2.0000 mg | Freq: Once | INTRAVENOUS | Status: AC
  Administered 2018-03-20: 2 mg via INTRAVENOUS
  Filled 2018-03-20: qty 1

## 2018-03-20 MED ORDER — FENTANYL CITRATE (PF) 100 MCG/2ML IJ SOLN
25.0000 ug | INTRAMUSCULAR | Status: DC | PRN
  Administered 2018-03-21: 25 ug via INTRAVENOUS
  Filled 2018-03-20: qty 2

## 2018-03-20 MED ORDER — PRAVASTATIN SODIUM 40 MG PO TABS: 40.0000 mg | ORAL_TABLET | Freq: Every day | ORAL | Status: DC

## 2018-03-20 MED ORDER — ONDANSETRON HCL 4 MG/2ML IJ SOLN: 4.0000 mg | Freq: Four times a day (QID) | INTRAMUSCULAR | Status: DC | PRN

## 2018-03-20 MED ORDER — FENTANYL CITRATE (PF) 100 MCG/2ML IJ SOLN
25.0000 ug | Freq: Once | INTRAMUSCULAR | Status: AC
  Administered 2018-03-20: 25 ug via INTRAVENOUS
  Filled 2018-03-20: qty 2

## 2018-03-20 MED ORDER — INFLUENZA VAC SPLIT HIGH-DOSE 0.5 ML IM SUSY
0.5000 mL | PREFILLED_SYRINGE | INTRAMUSCULAR | Status: AC
  Administered 2018-03-21: 0.5 mL via INTRAMUSCULAR
  Filled 2018-03-20: qty 0.5

## 2018-03-20 MED ORDER — LEVOTHYROXINE SODIUM 25 MCG PO TABS
25.0000 ug | ORAL_TABLET | Freq: Every day | ORAL | Status: DC
  Administered 2018-03-21: 25 ug via ORAL
  Filled 2018-03-20: qty 1

## 2018-03-20 MED ORDER — SODIUM CHLORIDE 0.9 % IV BOLUS
1000.0000 mL | Freq: Once | INTRAVENOUS | Status: AC
  Administered 2018-03-20: 1000 mL via INTRAVENOUS

## 2018-03-20 MED ORDER — ACETAMINOPHEN 325 MG PO TABS: 650.0000 mg | ORAL_TABLET | Freq: Four times a day (QID) | ORAL | Status: DC | PRN

## 2018-03-20 MED ORDER — FENTANYL CITRATE (PF) 100 MCG/2ML IJ SOLN
12.5000 ug | Freq: Once | INTRAMUSCULAR | Status: AC
  Administered 2018-03-20: 12.5 ug via INTRAVENOUS
  Filled 2018-03-20: qty 2

## 2018-03-20 MED ORDER — BISOPROLOL-HYDROCHLOROTHIAZIDE 5-6.25 MG PO TABS
1.0000 | ORAL_TABLET | Freq: Every day | ORAL | Status: DC
  Administered 2018-03-21: 1 via ORAL
  Filled 2018-03-20: qty 1

## 2018-03-20 NOTE — ED Notes (Signed)
ED Provider at bedside. 
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
## 2018-03-20 NOTE — ED Provider Notes (Signed)
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 initial encounter  Mediastinal hematoma, initial encounter    ED Discharge Orders    None       Duffy Bruce, MD 03/20/18 1856

## 2018-03-20 NOTE — ED Notes (Signed)
Patient continues to be in CT.  Checked in on daughter and offered food/drink.  Refused.  Updated on delays
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
## 2018-03-20 NOTE — ED Notes (Signed)
EDP at bedside  

## 2018-03-20 NOTE — Plan of Care (Signed)
  Problem: Activity: Goal: Risk for activity intolerance will decrease Outcome: Progressing   Problem: Elimination: Goal: Will not experience complications related to bowel motility Outcome: Progressing   Problem: Safety: Goal: Ability to remain free from injury will improve Outcome: Progressing   

## 2018-03-20 NOTE — ED Notes (Signed)
Attempted report x1. 

## 2018-03-20 NOTE — ED Notes (Signed)
Pt transported to radiology.

## 2018-03-20 NOTE — ED Notes (Signed)
Light green hemolyzed, per lab.  Will recollect

## 2018-03-21 ENCOUNTER — Observation Stay (HOSPITAL_COMMUNITY): Payer: Medicare Other

## 2018-03-21 DIAGNOSIS — I480 Paroxysmal atrial fibrillation: Secondary | ICD-10-CM | POA: Diagnosis not present

## 2018-03-21 DIAGNOSIS — I4891 Unspecified atrial fibrillation: Secondary | ICD-10-CM | POA: Diagnosis not present

## 2018-03-21 DIAGNOSIS — S2220XA Unspecified fracture of sternum, initial encounter for closed fracture: Secondary | ICD-10-CM | POA: Diagnosis not present

## 2018-03-21 LAB — BASIC METABOLIC PANEL
ANION GAP: 13 (ref 5–15)
BUN: 20 mg/dL (ref 8–23)
CALCIUM: 9 mg/dL (ref 8.9–10.3)
CHLORIDE: 101 mmol/L (ref 98–111)
CO2: 25 mmol/L (ref 22–32)
Creatinine, Ser: 0.72 mg/dL (ref 0.44–1.00)
GFR calc Af Amer: >60 mL/min (ref >60)
GFR calc non Af Amer: >60 mL/min (ref >60)
Glucose, Bld: 118 mg/dL — ABNORMAL HIGH (ref 70–99)
POTASSIUM: 4 mmol/L (ref 3.5–5.1)
Sodium: 139 mmol/L (ref 135–145)

## 2018-03-21 LAB — CBC
HEMATOCRIT: 45.9 % (ref 36.0–46.0)
HEMOGLOBIN: 15 g/dL (ref 12.0–15.0)
MCH: 31.7 pg (ref 26.0–34.0)
MCHC: 32.7 g/dL (ref 30.0–36.0)
MCV: 97 fL (ref 78.0–100.0)
Platelets: 130 10*3/uL — ABNORMAL LOW (ref 150–400)
RBC: 4.73 MIL/uL (ref 3.87–5.11)
RDW: 13.4 % (ref 11.5–15.5)
WBC: 9 10*3/uL (ref 4.0–10.5)

## 2018-03-21 LAB — TSH: TSH: 1.441 u[IU]/mL (ref 0.350–4.500)

## 2018-03-21 LAB — TROPONIN I
Troponin I: <0.03 ng/mL (ref <0.03)
Troponin I: <0.03 ng/mL (ref <0.03)

## 2018-03-21 MED ORDER — ACETAMINOPHEN 325 MG PO TABS: 650.0000 mg | ORAL_TABLET | Freq: Four times a day (QID) | ORAL | 0 refills | Status: DC | PRN
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
## 2018-03-21 NOTE — Progress Notes (Signed)
Patient was discharged home. Patient denied chest pain, dizziness and shortness of breath. CCMD was called when tele was removed.   Transported with staff in the wheelchair.
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
## 2018-03-27 ENCOUNTER — Emergency Department (HOSPITAL_COMMUNITY)
Admission: EM | Admit: 2018-03-27 | Discharge: 2018-03-27 | Disposition: A | Discharge status: Home / Self Care | Payer: Medicare Other | Attending: Emergency Medicine | Admitting: Emergency Medicine

## 2018-03-27 ENCOUNTER — Emergency Department (HOSPITAL_COMMUNITY): Payer: Medicare Other

## 2018-03-27 ENCOUNTER — Encounter (HOSPITAL_COMMUNITY): Payer: Self-pay

## 2018-03-27 DIAGNOSIS — R410 Disorientation, unspecified: Secondary | ICD-10-CM | POA: Diagnosis not present

## 2018-03-27 DIAGNOSIS — I10 Essential (primary) hypertension: Secondary | ICD-10-CM | POA: Diagnosis not present

## 2018-03-27 DIAGNOSIS — Z7982 Long term (current) use of aspirin: Secondary | ICD-10-CM | POA: Diagnosis not present

## 2018-03-27 DIAGNOSIS — N390 Urinary tract infection, site not specified: Secondary | ICD-10-CM | POA: Insufficient documentation

## 2018-03-27 DIAGNOSIS — R296 Repeated falls: Secondary | ICD-10-CM | POA: Diagnosis not present

## 2018-03-27 DIAGNOSIS — R17 Unspecified jaundice: Secondary | ICD-10-CM | POA: Diagnosis not present

## 2018-03-27 DIAGNOSIS — R945 Abnormal results of liver function studies: Secondary | ICD-10-CM | POA: Insufficient documentation

## 2018-03-27 DIAGNOSIS — R0789 Other chest pain: Secondary | ICD-10-CM | POA: Diagnosis not present

## 2018-03-27 DIAGNOSIS — Z79899 Other long term (current) drug therapy: Secondary | ICD-10-CM | POA: Diagnosis not present

## 2018-03-27 DIAGNOSIS — R748 Abnormal levels of other serum enzymes: Secondary | ICD-10-CM | POA: Insufficient documentation

## 2018-03-27 DIAGNOSIS — I4891 Unspecified atrial fibrillation: Secondary | ICD-10-CM | POA: Insufficient documentation

## 2018-03-27 DIAGNOSIS — R7989 Other specified abnormal findings of blood chemistry: Secondary | ICD-10-CM

## 2018-03-27 DIAGNOSIS — S299XXA Unspecified injury of thorax, initial encounter: Secondary | ICD-10-CM | POA: Diagnosis not present

## 2018-03-27 DIAGNOSIS — K7689 Other specified diseases of liver: Secondary | ICD-10-CM | POA: Diagnosis not present

## 2018-03-27 DIAGNOSIS — S3991XA Unspecified injury of abdomen, initial encounter: Secondary | ICD-10-CM | POA: Diagnosis not present

## 2018-03-27 DIAGNOSIS — R0902 Hypoxemia: Secondary | ICD-10-CM | POA: Diagnosis not present

## 2018-03-27 DIAGNOSIS — S0990XA Unspecified injury of head, initial encounter: Secondary | ICD-10-CM | POA: Diagnosis not present

## 2018-03-27 DIAGNOSIS — F0390 Unspecified dementia without behavioral disturbance: Secondary | ICD-10-CM | POA: Diagnosis not present

## 2018-03-27 DIAGNOSIS — R4182 Altered mental status, unspecified: Secondary | ICD-10-CM | POA: Diagnosis present

## 2018-03-27 DIAGNOSIS — E039 Hypothyroidism, unspecified: Secondary | ICD-10-CM | POA: Insufficient documentation

## 2018-03-27 DIAGNOSIS — K838 Other specified diseases of biliary tract: Secondary | ICD-10-CM | POA: Diagnosis not present

## 2018-03-27 LAB — I-STAT CG4 LACTIC ACID, ED: Lactic Acid, Venous: 1.89 mmol/L (ref 0.5–1.9)

## 2018-03-27 LAB — URINALYSIS, ROUTINE W REFLEX MICROSCOPIC
BILIRUBIN URINE: NEGATIVE
Glucose, UA: NEGATIVE mg/dL
HGB URINE DIPSTICK: NEGATIVE
Ketones, ur: NEGATIVE mg/dL
LEUKOCYTES UA: NEGATIVE
NITRITE: POSITIVE — AB
PH: 7 (ref 5.0–8.0)
Protein, ur: NEGATIVE mg/dL
SPECIFIC GRAVITY, URINE: 1.018 (ref 1.005–1.030)

## 2018-03-27 LAB — CBC WITH DIFFERENTIAL/PLATELET
Abs Immature Granulocytes: 0.1 10*3/uL (ref 0.0–0.1)
BASOS ABS: 0.1 10*3/uL (ref 0.0–0.1)
BASOS PCT: 1 %
EOS PCT: 0 %
Eosinophils Absolute: 0 10*3/uL (ref 0.0–0.7)
HCT: 45.9 % (ref 36.0–46.0)
Hemoglobin: 14.9 g/dL (ref 12.0–15.0)
Immature Granulocytes: 1 %
Lymphocytes Relative: 13 %
Lymphs Abs: 1 10*3/uL (ref 0.7–4.0)
MCH: 31.8 pg (ref 26.0–34.0)
MCHC: 32.5 g/dL (ref 30.0–36.0)
MCV: 97.9 fL (ref 78.0–100.0)
MONO ABS: 1.1 10*3/uL — AB (ref 0.1–1.0)
MONOS PCT: 15 %
Neutro Abs: 5.1 10*3/uL (ref 1.7–7.7)
Neutrophils Relative %: 70 %
Platelets: 129 10*3/uL — ABNORMAL LOW (ref 150–400)
RBC: 4.69 MIL/uL (ref 3.87–5.11)
RDW: 14.2 % (ref 11.5–15.5)
WBC: 7.3 10*3/uL (ref 4.0–10.5)

## 2018-03-27 LAB — COMPREHENSIVE METABOLIC PANEL
ALT: 159 U/L — AB (ref 0–44)
ANION GAP: 14 (ref 5–15)
AST: 230 U/L — ABNORMAL HIGH (ref 15–41)
Albumin: 2.9 g/dL — ABNORMAL LOW (ref 3.5–5.0)
Alkaline Phosphatase: 498 U/L — ABNORMAL HIGH (ref 38–126)
BUN: 16 mg/dL (ref 8–23)
CO2: 20 mmol/L — AB (ref 22–32)
CREATININE: 0.64 mg/dL (ref 0.44–1.00)
Calcium: 8.3 mg/dL — ABNORMAL LOW (ref 8.9–10.3)
Chloride: 104 mmol/L (ref 98–111)
Glucose, Bld: 151 mg/dL — ABNORMAL HIGH (ref 70–99)
Potassium: 4.5 mmol/L (ref 3.5–5.1)
SODIUM: 138 mmol/L (ref 135–145)
Total Bilirubin: 6.4 mg/dL — ABNORMAL HIGH (ref 0.3–1.2)
Total Protein: 6.2 g/dL — ABNORMAL LOW (ref 6.5–8.1)

## 2018-03-27 LAB — I-STAT TROPONIN, ED: Troponin i, poc: 0.04 ng/mL (ref 0.00–0.08)

## 2018-03-27 LAB — ACETAMINOPHEN LEVEL: Acetaminophen (Tylenol), Serum: <10 ug/mL — ABNORMAL LOW (ref 10–30)

## 2018-03-27 LAB — PROTIME-INR
INR: 1.17
PROTHROMBIN TIME: 14.8 s (ref 11.4–15.2)

## 2018-03-27 LAB — AMMONIA: AMMONIA: 25 umol/L (ref 9–35)

## 2018-03-27 MED ORDER — DILTIAZEM HCL 25 MG/5ML IV SOLN
10.0000 mg | Freq: Once | INTRAVENOUS | Status: AC
  Administered 2018-03-27: 10 mg via INTRAVENOUS
  Filled 2018-03-27: qty 5

## 2018-03-27 MED ORDER — CEPHALEXIN 500 MG PO CAPS: ORAL_CAPSULE | ORAL | 0 refills | Status: DC

## 2018-03-27 MED ORDER — SODIUM CHLORIDE 0.9 % IV SOLN
1000.0000 mL | Freq: Once | INTRAVENOUS | Status: AC
  Administered 2018-03-27: 1000 mL via INTRAVENOUS

## 2018-03-27 MED ORDER — SODIUM CHLORIDE 0.9 % IV SOLN
1.0000 g | Freq: Once | INTRAVENOUS | Status: AC
  Administered 2018-03-27: 1 g via INTRAVENOUS
  Filled 2018-03-27: qty 10

## 2018-03-27 MED ORDER — IOHEXOL 300 MG/ML  SOLN
100.0000 mL | Freq: Once | INTRAMUSCULAR | Status: AC | PRN
  Administered 2018-03-27: 100 mL via INTRAVENOUS

## 2018-03-27 NOTE — ED Notes (Signed)
Patient transported to Ultrasound 

## 2018-03-27 NOTE — Discharge Instructions (Addendum)
Avoid Tylenol and alcohol. Return to the emergency department immediately if you develop fever, abdominal pain, confusion, nausea, vomiting, loss of appetite, lethargy, severe back pain, headache or if the yellowing of your skin and eyes becomes significantly worse. Please take all of your antibiotics as directed.

## 2018-03-27 NOTE — ED Notes (Signed)
Patient transported to CT 

## 2018-03-27 NOTE — ED Notes (Signed)
Pt stable, ambulatory, states understanding of discharge instructions, family transporting home.
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
## 2018-03-30 LAB — URINE CULTURE: Culture: >=100000 — AB

## 2018-03-31 ENCOUNTER — Telehealth: Payer: Self-pay | Admitting: Emergency Medicine

## 2018-03-31 DIAGNOSIS — K838 Other specified diseases of biliary tract: Secondary | ICD-10-CM | POA: Diagnosis not present

## 2018-03-31 DIAGNOSIS — R945 Abnormal results of liver function studies: Secondary | ICD-10-CM | POA: Diagnosis not present

## 2018-03-31 NOTE — Telephone Encounter (Signed)
Post ED Visit - Positive Culture Follow-up  Culture report reviewed by antimicrobial stewardship pharmacist:  []  Elenor Quinones, Pharm.D. []  Heide Guile, Pharm.D., BCPS AQ-ID []  Parks Neptune, Pharm.D., BCPS []  Alycia Rossetti, Pharm.D., BCPS []  [place], Pharm.D., BCPS, AAHIVP []  Legrand Como, Pharm.D., BCPS, AAHIVP []  Salome Arnt, PharmD, BCPS []  Johnnette Gourd, PharmD, BCPS [x]  Hughes Better, PharmD, BCPS []  Leeroy Cha, PharmD  Positive urine culture Treated with cephalexin, organism sensitive to the same and no further patient follow-up is required at this time.  Hazle Nordmann 03/31/2018, 3:46 PM

## 2018-04-04 ENCOUNTER — Encounter: Payer: Self-pay | Admitting: Family Medicine

## 2018-04-04 DIAGNOSIS — F0781 Postconcussional syndrome: Secondary | ICD-10-CM | POA: Diagnosis not present

## 2018-04-04 DIAGNOSIS — R410 Disorientation, unspecified: Secondary | ICD-10-CM | POA: Diagnosis not present

## 2018-04-04 DIAGNOSIS — E039 Hypothyroidism, unspecified: Secondary | ICD-10-CM | POA: Diagnosis not present

## 2018-04-04 DIAGNOSIS — R945 Abnormal results of liver function studies: Secondary | ICD-10-CM | POA: Diagnosis not present

## 2018-04-04 DIAGNOSIS — G4700 Insomnia, unspecified: Secondary | ICD-10-CM | POA: Diagnosis not present

## 2018-04-04 DIAGNOSIS — S2220XA Unspecified fracture of sternum, initial encounter for closed fracture: Secondary | ICD-10-CM | POA: Diagnosis not present

## 2018-04-04 DIAGNOSIS — I4891 Unspecified atrial fibrillation: Secondary | ICD-10-CM | POA: Diagnosis not present

## 2018-04-04 DIAGNOSIS — I129 Hypertensive chronic kidney disease with stage 1 through stage 4 chronic kidney disease, or unspecified chronic kidney disease: Secondary | ICD-10-CM | POA: Diagnosis not present

## 2018-04-08 ENCOUNTER — Ambulatory Visit (HOSPITAL_COMMUNITY)
Admission: RE | Admit: 2018-04-08 | Discharge: 2018-04-08 | Disposition: A | Discharge status: Home / Self Care | Payer: Medicare Other | Source: Ambulatory Visit | Attending: Nurse Practitioner | Admitting: Nurse Practitioner

## 2018-04-08 VITALS — BP 152/72 | HR 100 | Ht 65.0 in | Wt 164.0 lb

## 2018-04-08 DIAGNOSIS — Z79899 Other long term (current) drug therapy: Secondary | ICD-10-CM | POA: Diagnosis not present

## 2018-04-08 DIAGNOSIS — E7800 Pure hypercholesterolemia, unspecified: Secondary | ICD-10-CM | POA: Diagnosis not present

## 2018-04-08 DIAGNOSIS — I4891 Unspecified atrial fibrillation: Secondary | ICD-10-CM | POA: Insufficient documentation

## 2018-04-08 DIAGNOSIS — F419 Anxiety disorder, unspecified: Secondary | ICD-10-CM | POA: Diagnosis not present

## 2018-04-08 DIAGNOSIS — I10 Essential (primary) hypertension: Secondary | ICD-10-CM | POA: Diagnosis not present

## 2018-04-08 DIAGNOSIS — Z7901 Long term (current) use of anticoagulants: Secondary | ICD-10-CM | POA: Diagnosis not present

## 2018-04-08 MED ORDER — APIXABAN 5 MG PO TABS: 5.0000 mg | ORAL_TABLET | Freq: Two times a day (BID) | ORAL | 0 refills | Status: DC

## 2018-04-08 MED ORDER — BISOPROLOL-HYDROCHLOROTHIAZIDE 10-6.25 MG PO TABS: 1.0000 | ORAL_TABLET | Freq: Every day | ORAL | 3 refills | Status: DC

## 2018-04-08 NOTE — Patient Instructions (Addendum)
Stop aspirin  Start Eliquis 5mg  twice a day when cleared by GI, will discuss further on f/u 10/7  Increase ziac to 10mg /6.25mg  once a day -- new prescription was sent
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
## 2018-04-14 ENCOUNTER — Encounter (HOSPITAL_COMMUNITY): Payer: Self-pay | Admitting: Nurse Practitioner

## 2018-04-14 ENCOUNTER — Ambulatory Visit (HOSPITAL_COMMUNITY)
Admission: RE | Admit: 2018-04-14 | Discharge: 2018-04-14 | Disposition: A | Discharge status: Home / Self Care | Payer: Medicare Other | Source: Ambulatory Visit | Attending: Nurse Practitioner | Admitting: Nurse Practitioner

## 2018-04-14 VITALS — BP 132/72 | HR 80 | Ht 65.0 in | Wt 164.0 lb

## 2018-04-14 DIAGNOSIS — Z9841 Cataract extraction status, right eye: Secondary | ICD-10-CM | POA: Diagnosis not present

## 2018-04-14 DIAGNOSIS — Z9049 Acquired absence of other specified parts of digestive tract: Secondary | ICD-10-CM | POA: Diagnosis not present

## 2018-04-14 DIAGNOSIS — Z90710 Acquired absence of both cervix and uterus: Secondary | ICD-10-CM | POA: Diagnosis not present

## 2018-04-14 DIAGNOSIS — Z9842 Cataract extraction status, left eye: Secondary | ICD-10-CM | POA: Insufficient documentation

## 2018-04-14 DIAGNOSIS — F419 Anxiety disorder, unspecified: Secondary | ICD-10-CM | POA: Insufficient documentation

## 2018-04-14 DIAGNOSIS — Z7901 Long term (current) use of anticoagulants: Secondary | ICD-10-CM | POA: Insufficient documentation

## 2018-04-14 DIAGNOSIS — Z8249 Family history of ischemic heart disease and other diseases of the circulatory system: Secondary | ICD-10-CM | POA: Insufficient documentation

## 2018-04-14 DIAGNOSIS — R945 Abnormal results of liver function studies: Secondary | ICD-10-CM | POA: Diagnosis not present

## 2018-04-14 DIAGNOSIS — I10 Essential (primary) hypertension: Secondary | ICD-10-CM | POA: Insufficient documentation

## 2018-04-14 DIAGNOSIS — Z79899 Other long term (current) drug therapy: Secondary | ICD-10-CM | POA: Diagnosis not present

## 2018-04-14 DIAGNOSIS — Z79890 Hormone replacement therapy (postmenopausal): Secondary | ICD-10-CM | POA: Diagnosis not present

## 2018-04-14 DIAGNOSIS — Z833 Family history of diabetes mellitus: Secondary | ICD-10-CM | POA: Diagnosis not present

## 2018-04-14 DIAGNOSIS — E7800 Pure hypercholesterolemia, unspecified: Secondary | ICD-10-CM | POA: Diagnosis not present

## 2018-04-14 DIAGNOSIS — I4891 Unspecified atrial fibrillation: Secondary | ICD-10-CM | POA: Diagnosis not present

## 2018-04-14 DIAGNOSIS — Z961 Presence of intraocular lens: Secondary | ICD-10-CM | POA: Diagnosis not present
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
## 2018-04-18 ENCOUNTER — Telehealth (HOSPITAL_COMMUNITY): Payer: Self-pay | Admitting: *Deleted

## 2018-04-18 NOTE — Telephone Encounter (Signed)
Calling regarding her blood thinner.  Daughter Katharine Look called stating she heard back from GI doctor and that he cleared her to start, however, she has her reservations and wants to know if she can be monitored instead.  Will refer to Roderic Palau for info on stroke risk and blood thinner.

## 2018-04-18 NOTE — Addendum Note (Signed)
Encounter addended by: Sherran Needs, NP on: 04/18/2018 2:51 PM  Actions taken: Sign clinical note

## 2018-04-23 NOTE — Addendum Note (Signed)
Encounter addended by: Sherran Needs, NP on: 04/23/2018 3:03 PM  Actions taken: Sign clinical note

## 2018-05-12 ENCOUNTER — Ambulatory Visit (HOSPITAL_COMMUNITY)
Admission: RE | Admit: 2018-05-12 | Discharge: 2018-05-12 | Disposition: A | Discharge status: Home / Self Care | Payer: Medicare Other | Source: Ambulatory Visit | Attending: Nurse Practitioner | Admitting: Nurse Practitioner

## 2018-05-12 ENCOUNTER — Encounter (HOSPITAL_COMMUNITY): Payer: Self-pay | Admitting: Nurse Practitioner

## 2018-05-12 VITALS — BP 148/62 | HR 68 | Ht 65.0 in | Wt 163.0 lb

## 2018-05-12 DIAGNOSIS — I4891 Unspecified atrial fibrillation: Secondary | ICD-10-CM | POA: Insufficient documentation

## 2018-05-12 DIAGNOSIS — Z961 Presence of intraocular lens: Secondary | ICD-10-CM | POA: Insufficient documentation

## 2018-05-12 DIAGNOSIS — Z79899 Other long term (current) drug therapy: Secondary | ICD-10-CM | POA: Insufficient documentation

## 2018-05-12 DIAGNOSIS — Z9049 Acquired absence of other specified parts of digestive tract: Secondary | ICD-10-CM | POA: Insufficient documentation

## 2018-05-12 DIAGNOSIS — Z9841 Cataract extraction status, right eye: Secondary | ICD-10-CM | POA: Diagnosis not present

## 2018-05-12 DIAGNOSIS — Z79890 Hormone replacement therapy (postmenopausal): Secondary | ICD-10-CM | POA: Diagnosis not present

## 2018-05-12 DIAGNOSIS — Z7901 Long term (current) use of anticoagulants: Secondary | ICD-10-CM | POA: Insufficient documentation

## 2018-05-12 DIAGNOSIS — I10 Essential (primary) hypertension: Secondary | ICD-10-CM | POA: Insufficient documentation

## 2018-05-12 DIAGNOSIS — Z8249 Family history of ischemic heart disease and other diseases of the circulatory system: Secondary | ICD-10-CM | POA: Diagnosis not present

## 2018-05-12 DIAGNOSIS — Z833 Family history of diabetes mellitus: Secondary | ICD-10-CM | POA: Insufficient documentation

## 2018-05-12 DIAGNOSIS — Z9842 Cataract extraction status, left eye: Secondary | ICD-10-CM | POA: Insufficient documentation

## 2018-05-12 DIAGNOSIS — Z90710 Acquired absence of both cervix and uterus: Secondary | ICD-10-CM | POA: Diagnosis not present

## 2018-05-12 DIAGNOSIS — E7800 Pure hypercholesterolemia, unspecified: Secondary | ICD-10-CM | POA: Diagnosis not present

## 2018-05-12 LAB — CBC
HCT: 48.8 % — ABNORMAL HIGH (ref 36.0–46.0)
HEMOGLOBIN: 15.6 g/dL — AB (ref 12.0–15.0)
MCH: 31.7 pg (ref 26.0–34.0)
MCHC: 32 g/dL (ref 30.0–36.0)
MCV: 99.2 fL (ref 80.0–100.0)
NRBC: 0 % (ref 0.0–0.2)
Platelets: 131 10*3/uL — ABNORMAL LOW (ref 150–400)
RBC: 4.92 MIL/uL (ref 3.87–5.11)
RDW: 13.2 % (ref 11.5–15.5)
WBC: 5.3 10*3/uL (ref 4.0–10.5)

## 2018-05-12 LAB — COMPREHENSIVE METABOLIC PANEL
ALK PHOS: 98 U/L (ref 38–126)
ALT: 22 U/L (ref 0–44)
AST: 30 U/L (ref 15–41)
Albumin: 3.5 g/dL (ref 3.5–5.0)
Anion gap: 6 (ref 5–15)
BUN: 23 mg/dL (ref 8–23)
CALCIUM: 9.4 mg/dL (ref 8.9–10.3)
CHLORIDE: 104 mmol/L (ref 98–111)
CO2: 30 mmol/L (ref 22–32)
CREATININE: 0.87 mg/dL (ref 0.44–1.00)
GFR, EST NON AFRICAN AMERICAN: 57 mL/min — AB (ref >60)
Glucose, Bld: 153 mg/dL — ABNORMAL HIGH (ref 70–99)
Potassium: 4 mmol/L (ref 3.5–5.1)
SODIUM: 140 mmol/L (ref 135–145)
Total Bilirubin: 2.2 mg/dL — ABNORMAL HIGH (ref 0.3–1.2)
Total Protein: 6.4 g/dL — ABNORMAL LOW (ref 6.5–8.1)
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
## 2018-05-19 ENCOUNTER — Other Ambulatory Visit (HOSPITAL_COMMUNITY): Payer: Self-pay | Admitting: Nurse Practitioner

## 2018-06-13 DIAGNOSIS — I4891 Unspecified atrial fibrillation: Secondary | ICD-10-CM | POA: Diagnosis not present

## 2018-06-13 DIAGNOSIS — F322 Major depressive disorder, single episode, severe without psychotic features: Secondary | ICD-10-CM | POA: Diagnosis not present

## 2018-06-13 DIAGNOSIS — R7301 Impaired fasting glucose: Secondary | ICD-10-CM | POA: Diagnosis not present

## 2018-06-13 DIAGNOSIS — E039 Hypothyroidism, unspecified: Secondary | ICD-10-CM | POA: Diagnosis not present

## 2018-06-13 DIAGNOSIS — N183 Chronic kidney disease, stage 3 (moderate): Secondary | ICD-10-CM | POA: Diagnosis not present

## 2018-06-13 DIAGNOSIS — R413 Other amnesia: Secondary | ICD-10-CM | POA: Diagnosis not present

## 2018-06-13 DIAGNOSIS — I129 Hypertensive chronic kidney disease with stage 1 through stage 4 chronic kidney disease, or unspecified chronic kidney disease: Secondary | ICD-10-CM | POA: Diagnosis not present

## 2018-06-13 DIAGNOSIS — E782 Mixed hyperlipidemia: Secondary | ICD-10-CM | POA: Diagnosis not present

## 2018-06-19 ENCOUNTER — Other Ambulatory Visit (HOSPITAL_COMMUNITY): Payer: Self-pay | Admitting: *Deleted

## 2018-06-19 MED ORDER — BISOPROLOL-HYDROCHLOROTHIAZIDE 10-6.25 MG PO TABS: 1.0000 | ORAL_TABLET | Freq: Every day | ORAL | 2 refills | Status: DC

## 2018-12-09 ENCOUNTER — Other Ambulatory Visit (HOSPITAL_COMMUNITY): Payer: Self-pay | Admitting: Nurse Practitioner

## 2019-02-27 DIAGNOSIS — I10 Essential (primary) hypertension: Secondary | ICD-10-CM | POA: Diagnosis not present

## 2019-02-27 DIAGNOSIS — E782 Mixed hyperlipidemia: Secondary | ICD-10-CM | POA: Diagnosis not present

## 2019-02-27 DIAGNOSIS — E039 Hypothyroidism, unspecified: Secondary | ICD-10-CM | POA: Diagnosis not present

## 2019-02-27 DIAGNOSIS — E559 Vitamin D deficiency, unspecified: Secondary | ICD-10-CM | POA: Diagnosis not present

## 2019-02-27 DIAGNOSIS — R7301 Impaired fasting glucose: Secondary | ICD-10-CM | POA: Diagnosis not present

## 2019-03-04 DIAGNOSIS — F322 Major depressive disorder, single episode, severe without psychotic features: Secondary | ICD-10-CM | POA: Diagnosis not present

## 2019-03-04 DIAGNOSIS — I4891 Unspecified atrial fibrillation: Secondary | ICD-10-CM | POA: Diagnosis not present

## 2019-03-04 DIAGNOSIS — R7301 Impaired fasting glucose: Secondary | ICD-10-CM | POA: Diagnosis not present

## 2019-03-04 DIAGNOSIS — E559 Vitamin D deficiency, unspecified: Secondary | ICD-10-CM | POA: Diagnosis not present

## 2019-03-04 DIAGNOSIS — E039 Hypothyroidism, unspecified: Secondary | ICD-10-CM | POA: Diagnosis not present

## 2019-03-04 DIAGNOSIS — I129 Hypertensive chronic kidney disease with stage 1 through stage 4 chronic kidney disease, or unspecified chronic kidney disease: Secondary | ICD-10-CM | POA: Diagnosis not present

## 2019-03-04 DIAGNOSIS — M8580 Other specified disorders of bone density and structure, unspecified site: Secondary | ICD-10-CM | POA: Diagnosis not present

## 2019-03-04 DIAGNOSIS — N183 Chronic kidney disease, stage 3 (moderate): Secondary | ICD-10-CM | POA: Diagnosis not present

## 2019-03-04 DIAGNOSIS — F411 Generalized anxiety disorder: Secondary | ICD-10-CM | POA: Diagnosis not present

## 2019-03-04 DIAGNOSIS — Z0000 Encounter for general adult medical examination without abnormal findings: Secondary | ICD-10-CM | POA: Diagnosis not present

## 2019-03-04 DIAGNOSIS — E782 Mixed hyperlipidemia: Secondary | ICD-10-CM | POA: Diagnosis not present

## 2019-03-04 DIAGNOSIS — D6869 Other thrombophilia: Secondary | ICD-10-CM | POA: Diagnosis not present

## 2019-03-14 ENCOUNTER — Other Ambulatory Visit (HOSPITAL_COMMUNITY): Payer: Self-pay | Admitting: Nurse Practitioner

## 2019-05-01 DIAGNOSIS — Z23 Encounter for immunization: Secondary | ICD-10-CM | POA: Diagnosis not present

## 2019-06-22 ENCOUNTER — Other Ambulatory Visit (HOSPITAL_COMMUNITY): Payer: Self-pay | Admitting: Nurse Practitioner

## 2019-08-10 DIAGNOSIS — H6122 Impacted cerumen, left ear: Secondary | ICD-10-CM | POA: Diagnosis not present

## 2019-08-20 ENCOUNTER — Other Ambulatory Visit (HOSPITAL_COMMUNITY): Payer: Self-pay | Admitting: *Deleted

## 2019-08-20 MED ORDER — APIXABAN 5 MG PO TABS: 5.0000 mg | ORAL_TABLET | Freq: Two times a day (BID) | ORAL | 3 refills | Status: DC

## 2019-09-07 DIAGNOSIS — R7309 Other abnormal glucose: Secondary | ICD-10-CM | POA: Diagnosis not present

## 2019-09-07 DIAGNOSIS — Z23 Encounter for immunization: Secondary | ICD-10-CM | POA: Diagnosis not present

## 2019-09-07 DIAGNOSIS — I4891 Unspecified atrial fibrillation: Secondary | ICD-10-CM | POA: Diagnosis not present

## 2019-09-07 DIAGNOSIS — I129 Hypertensive chronic kidney disease with stage 1 through stage 4 chronic kidney disease, or unspecified chronic kidney disease: Secondary | ICD-10-CM | POA: Diagnosis not present

## 2019-09-07 DIAGNOSIS — N183 Chronic kidney disease, stage 3 unspecified: Secondary | ICD-10-CM | POA: Diagnosis not present

## 2019-09-07 DIAGNOSIS — D6869 Other thrombophilia: Secondary | ICD-10-CM | POA: Diagnosis not present

## 2019-09-07 DIAGNOSIS — F322 Major depressive disorder, single episode, severe without psychotic features: Secondary | ICD-10-CM | POA: Diagnosis not present

## 2019-09-07 DIAGNOSIS — E039 Hypothyroidism, unspecified: Secondary | ICD-10-CM | POA: Diagnosis not present

## 2019-09-07 DIAGNOSIS — E782 Mixed hyperlipidemia: Secondary | ICD-10-CM | POA: Diagnosis not present

## 2019-09-14 ENCOUNTER — Other Ambulatory Visit (HOSPITAL_COMMUNITY): Payer: Self-pay | Admitting: Nurse Practitioner

## 2019-10-05 DIAGNOSIS — Z23 Encounter for immunization: Secondary | ICD-10-CM | POA: Diagnosis not present

## 2019-12-17 ENCOUNTER — Other Ambulatory Visit (HOSPITAL_COMMUNITY): Payer: Self-pay | Admitting: Nurse Practitioner

## 2019-12-17 MED ORDER — BISOPROLOL-HYDROCHLOROTHIAZIDE 10-6.25 MG PO TABS: 1.0000 | ORAL_TABLET | Freq: Every day | ORAL | 0 refills | Status: DC

## 2019-12-17 NOTE — Addendum Note (Signed)
Addended by: Juluis Mire on: 12/17/2019 09:22 AM   Modules accepted: Orders
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
## 2020-03-07 DIAGNOSIS — I129 Hypertensive chronic kidney disease with stage 1 through stage 4 chronic kidney disease, or unspecified chronic kidney disease: Secondary | ICD-10-CM | POA: Diagnosis not present

## 2020-03-07 DIAGNOSIS — D6869 Other thrombophilia: Secondary | ICD-10-CM | POA: Diagnosis not present

## 2020-03-07 DIAGNOSIS — Z0000 Encounter for general adult medical examination without abnormal findings: Secondary | ICD-10-CM | POA: Diagnosis not present

## 2020-03-07 DIAGNOSIS — F322 Major depressive disorder, single episode, severe without psychotic features: Secondary | ICD-10-CM | POA: Diagnosis not present

## 2020-03-07 DIAGNOSIS — N183 Chronic kidney disease, stage 3 unspecified: Secondary | ICD-10-CM | POA: Diagnosis not present

## 2020-03-07 DIAGNOSIS — E782 Mixed hyperlipidemia: Secondary | ICD-10-CM | POA: Diagnosis not present

## 2020-03-07 DIAGNOSIS — R7301 Impaired fasting glucose: Secondary | ICD-10-CM | POA: Diagnosis not present

## 2020-03-07 DIAGNOSIS — I4891 Unspecified atrial fibrillation: Secondary | ICD-10-CM | POA: Diagnosis not present

## 2020-03-07 DIAGNOSIS — F411 Generalized anxiety disorder: Secondary | ICD-10-CM | POA: Diagnosis not present

## 2020-03-07 DIAGNOSIS — E039 Hypothyroidism, unspecified: Secondary | ICD-10-CM | POA: Diagnosis not present

## 2020-03-07 DIAGNOSIS — Z7189 Other specified counseling: Secondary | ICD-10-CM | POA: Diagnosis not present

## 2020-03-07 DIAGNOSIS — Z23 Encounter for immunization: Secondary | ICD-10-CM | POA: Diagnosis not present

## 2020-03-22 ENCOUNTER — Other Ambulatory Visit: Payer: Self-pay | Admitting: Family Medicine

## 2020-03-22 DIAGNOSIS — R7989 Other specified abnormal findings of blood chemistry: Secondary | ICD-10-CM

## 2020-03-23 DIAGNOSIS — Z780 Asymptomatic menopausal state: Secondary | ICD-10-CM | POA: Diagnosis not present

## 2020-03-23 DIAGNOSIS — R945 Abnormal results of liver function studies: Secondary | ICD-10-CM | POA: Diagnosis not present

## 2020-03-23 DIAGNOSIS — M85851 Other specified disorders of bone density and structure, right thigh: Secondary | ICD-10-CM | POA: Diagnosis not present

## 2020-03-23 DIAGNOSIS — M85852 Other specified disorders of bone density and structure, left thigh: Secondary | ICD-10-CM | POA: Diagnosis not present

## 2020-03-25 DIAGNOSIS — Z9049 Acquired absence of other specified parts of digestive tract: Secondary | ICD-10-CM | POA: Diagnosis not present

## 2020-03-25 DIAGNOSIS — R945 Abnormal results of liver function studies: Secondary | ICD-10-CM | POA: Diagnosis not present

## 2020-03-28 ENCOUNTER — Other Ambulatory Visit: Payer: Self-pay | Admitting: Gastroenterology

## 2020-03-28 ENCOUNTER — Other Ambulatory Visit: Payer: Medicare Other

## 2020-03-28 DIAGNOSIS — K838 Other specified diseases of biliary tract: Secondary | ICD-10-CM | POA: Diagnosis not present

## 2020-03-28 DIAGNOSIS — Z7901 Long term (current) use of anticoagulants: Secondary | ICD-10-CM | POA: Diagnosis not present

## 2020-03-28 DIAGNOSIS — R634 Abnormal weight loss: Secondary | ICD-10-CM | POA: Diagnosis not present

## 2020-03-28 DIAGNOSIS — R748 Abnormal levels of other serum enzymes: Secondary | ICD-10-CM | POA: Diagnosis not present

## 2020-04-18 ENCOUNTER — Other Ambulatory Visit: Payer: Medicare Other

## 2020-04-20 ENCOUNTER — Ambulatory Visit
Admission: RE | Admit: 2020-04-20 | Discharge: 2020-04-20 | Disposition: A | Discharge status: Home / Self Care | Payer: Medicare Other | Source: Ambulatory Visit | Attending: Gastroenterology | Admitting: Gastroenterology

## 2020-04-20 ENCOUNTER — Other Ambulatory Visit: Payer: Self-pay

## 2020-04-20 DIAGNOSIS — K838 Other specified diseases of biliary tract: Secondary | ICD-10-CM

## 2020-04-20 DIAGNOSIS — R634 Abnormal weight loss: Secondary | ICD-10-CM

## 2020-04-20 MED ORDER — GADOBENATE DIMEGLUMINE 529 MG/ML IV SOLN
15.0000 mL | Freq: Once | INTRAVENOUS | Status: AC | PRN
  Administered 2020-04-20: 15 mL via INTRAVENOUS

## 2020-04-21 ENCOUNTER — Other Ambulatory Visit: Payer: Self-pay | Admitting: Gastroenterology

## 2020-04-22 NOTE — Progress Notes (Signed)
Attempted to obtain medical history via telephone, unable to reach at this time. I left a voicemail to return pre surgical testing department's phone call.  

## 2020-04-25 ENCOUNTER — Other Ambulatory Visit (HOSPITAL_COMMUNITY)
Admission: RE | Admit: 2020-04-25 | Discharge: 2020-04-25 | Disposition: A | Discharge status: Home / Self Care | Payer: Medicare Other | Source: Ambulatory Visit | Attending: Gastroenterology | Admitting: Gastroenterology

## 2020-04-25 DIAGNOSIS — Z20822 Contact with and (suspected) exposure to covid-19: Secondary | ICD-10-CM | POA: Insufficient documentation

## 2020-04-25 DIAGNOSIS — Z01812 Encounter for preprocedural laboratory examination: Secondary | ICD-10-CM | POA: Diagnosis not present

## 2020-04-25 LAB — SARS CORONAVIRUS 2 (TAT 6-24 HRS): SARS Coronavirus 2: NEGATIVE

## 2020-04-28 ENCOUNTER — Encounter (HOSPITAL_COMMUNITY)
Admission: RE | Disposition: A | Discharge status: Home / Self Care | Payer: Self-pay | Source: Home / Self Care | Attending: Gastroenterology

## 2020-04-28 ENCOUNTER — Encounter (HOSPITAL_COMMUNITY): Payer: Self-pay | Admitting: Gastroenterology

## 2020-04-28 ENCOUNTER — Ambulatory Visit (HOSPITAL_COMMUNITY): Payer: Medicare Other

## 2020-04-28 ENCOUNTER — Ambulatory Visit (HOSPITAL_COMMUNITY): Payer: Medicare Other | Admitting: Anesthesiology

## 2020-04-28 ENCOUNTER — Ambulatory Visit (HOSPITAL_COMMUNITY)
Admission: RE | Admit: 2020-04-28 | Discharge: 2020-04-28 | Disposition: A | Discharge status: Home / Self Care | Payer: Medicare Other | Attending: Gastroenterology | Admitting: Gastroenterology

## 2020-04-28 ENCOUNTER — Other Ambulatory Visit: Payer: Self-pay

## 2020-04-28 DIAGNOSIS — I4891 Unspecified atrial fibrillation: Secondary | ICD-10-CM | POA: Diagnosis not present

## 2020-04-28 DIAGNOSIS — Z9049 Acquired absence of other specified parts of digestive tract: Secondary | ICD-10-CM | POA: Diagnosis not present

## 2020-04-28 DIAGNOSIS — R634 Abnormal weight loss: Secondary | ICD-10-CM | POA: Insufficient documentation

## 2020-04-28 DIAGNOSIS — Z79899 Other long term (current) drug therapy: Secondary | ICD-10-CM | POA: Insufficient documentation

## 2020-04-28 DIAGNOSIS — K8050 Calculus of bile duct without cholangitis or cholecystitis without obstruction: Secondary | ICD-10-CM | POA: Insufficient documentation

## 2020-04-28 DIAGNOSIS — Z79890 Hormone replacement therapy (postmenopausal): Secondary | ICD-10-CM | POA: Insufficient documentation

## 2020-04-28 DIAGNOSIS — Z7901 Long term (current) use of anticoagulants: Secondary | ICD-10-CM | POA: Insufficient documentation

## 2020-04-28 DIAGNOSIS — E039 Hypothyroidism, unspecified: Secondary | ICD-10-CM | POA: Diagnosis not present

## 2020-04-28 DIAGNOSIS — I10 Essential (primary) hypertension: Secondary | ICD-10-CM | POA: Diagnosis not present

## 2020-04-28 HISTORY — PX: ERCP: SHX5425

## 2020-04-28 HISTORY — PX: REMOVAL OF STONES: SHX5545

## 2020-04-28 HISTORY — PX: SPHINCTEROTOMY: SHX5544

## 2020-04-28 SURGERY — ERCP, WITH INTERVENTION IF INDICATED
Anesthesia: General

## 2020-04-28 MED ORDER — PHENYLEPHRINE HCL (PRESSORS) 10 MG/ML IV SOLN
INTRAVENOUS | Status: DC | PRN
  Administered 2020-04-28: 80 ug via INTRAVENOUS

## 2020-04-28 MED ORDER — ONDANSETRON HCL 4 MG/2ML IJ SOLN: 4.0000 mg | Freq: Once | INTRAMUSCULAR | Status: DC | PRN

## 2020-04-28 MED ORDER — SODIUM CHLORIDE 0.9 % IV SOLN
INTRAVENOUS | Status: DC | PRN
  Administered 2020-04-28: 40 mL

## 2020-04-28 MED ORDER — ROCURONIUM BROMIDE 10 MG/ML (PF) SYRINGE
PREFILLED_SYRINGE | INTRAVENOUS | Status: DC | PRN
  Administered 2020-04-28: 10 mg via INTRAVENOUS

## 2020-04-28 MED ORDER — FENTANYL CITRATE (PF) 100 MCG/2ML IJ SOLN: 25.0000 ug | INTRAMUSCULAR | Status: DC | PRN

## 2020-04-28 MED ORDER — CIPROFLOXACIN IN D5W 400 MG/200ML IV SOLN
INTRAVENOUS | Status: AC
  Filled 2020-04-28: qty 200

## 2020-04-28 MED ORDER — PHENYLEPHRINE HCL-NACL 10-0.9 MG/250ML-% IV SOLN
INTRAVENOUS | Status: DC | PRN
  Administered 2020-04-28: 10 ug/min via INTRAVENOUS

## 2020-04-28 MED ORDER — LIDOCAINE 2% (20 MG/ML) 5 ML SYRINGE
INTRAMUSCULAR | Status: DC | PRN
  Administered 2020-04-28: 40 mg via INTRAVENOUS

## 2020-04-28 MED ORDER — DEXAMETHASONE SODIUM PHOSPHATE 10 MG/ML IJ SOLN
INTRAMUSCULAR | Status: DC | PRN
  Administered 2020-04-28: 4 mg via INTRAVENOUS

## 2020-04-28 MED ORDER — INDOMETHACIN 50 MG RE SUPP
RECTAL | Status: DC | PRN
  Administered 2020-04-28: 100 mg via RECTAL

## 2020-04-28 MED ORDER — PROPOFOL 10 MG/ML IV BOLUS
INTRAVENOUS | Status: DC | PRN
  Administered 2020-04-28: 30 mg via INTRAVENOUS
  Administered 2020-04-28: 70 mg via INTRAVENOUS

## 2020-04-28 MED ORDER — SODIUM CHLORIDE 0.9 % IV SOLN: INTRAVENOUS | Status: DC

## 2020-04-28 MED ORDER — ONDANSETRON HCL 4 MG/2ML IJ SOLN
INTRAMUSCULAR | Status: DC | PRN
  Administered 2020-04-28: 4 mg via INTRAVENOUS

## 2020-04-28 MED ORDER — CIPROFLOXACIN IN D5W 400 MG/200ML IV SOLN
INTRAVENOUS | Status: DC | PRN
  Administered 2020-04-28: 400 mg via INTRAVENOUS

## 2020-04-28 MED ORDER — SUCCINYLCHOLINE CHLORIDE 200 MG/10ML IV SOSY
PREFILLED_SYRINGE | INTRAVENOUS | Status: DC | PRN
  Administered 2020-04-28: 65 mg via INTRAVENOUS

## 2020-04-28 MED ORDER — INDOMETHACIN 50 MG RE SUPP
RECTAL | Status: AC
  Filled 2020-04-28: qty 2

## 2020-04-28 MED ORDER — SUGAMMADEX SODIUM 200 MG/2ML IV SOLN
INTRAVENOUS | Status: DC | PRN
  Administered 2020-04-28: 120 mg via INTRAVENOUS

## 2020-04-28 MED ORDER — LACTATED RINGERS IV SOLN: INTRAVENOUS | Status: DC

## 2020-04-28 MED ORDER — GLUCAGON HCL RDNA (DIAGNOSTIC) 1 MG IJ SOLR
INTRAMUSCULAR | Status: AC
  Filled 2020-04-28: qty 1

## 2020-04-28 NOTE — Discharge Instructions (Signed)
Call if question or problem otherwise if no signs of bleeding or problem restart Eliquis on Saturday and clear liquid diet until 6 PM and if doing well may have soft solids tonight otherwise if okay slowly advance tomorrow and repeat lab work in 1 to 2 weeks and follow-up in 3 to 4 weeks if doing well  YOU HAD AN ENDOSCOPIC PROCEDURE TODAY: Refer to the procedure report and other information in the discharge instructions given to you for any specific questions about what was found during the examination. If this information does not answer your questions, please call [facility] office at [phone] to clarify.   YOU SHOULD EXPECT: Some feelings of bloating in the abdomen. Passage of more gas than usual. Walking can help get rid of the air that was put into your GI tract during the procedure and reduce the bloating. If you had a lower endoscopy (such as a colonoscopy or flexible sigmoidoscopy) you may notice spotting of blood in your stool or on the toilet paper. Some abdominal soreness may be present for a day or two, also.  DIET: Your first meal following the procedure should be a light meal and then it is ok to progress to your normal diet. A half-sandwich or bowl of soup is an example of a good first meal. Heavy or fried foods are harder to digest and may make you feel nauseous or bloated. Drink plenty of fluids but you should avoid alcoholic beverages for 24 hours. If you had a esophageal dilation, please see attached instructions for diet.    ACTIVITY: Your care partner should take you home directly after the procedure. You should plan to take it easy, moving slowly for the rest of the day. You can resume normal activity the day after the procedure however YOU SHOULD NOT DRIVE, use power tools, machinery or perform tasks that involve climbing or major physical exertion for 24 hours (because of the sedation medicines used during the test).   SYMPTOMS TO REPORT IMMEDIATELY: A gastroenterologist can be  reached at any hour. Please call [phone]  for any of the following symptoms:    . Following upper endoscopy (EGD, EUS, ERCP, esophageal dilation) Vomiting of blood or coffee ground material  New, significant abdominal pain  New, significant chest pain or pain under the shoulder blades  Painful or persistently difficult swallowing  New shortness of breath  Black, tarry-looking or red, bloody stools  FOLLOW UP:  If any biopsies were taken you will be contacted by phone or by letter within the next 1-3 weeks. Call [phone]  if you have not heard about the biopsies in 3 weeks.  Please also call with any specific questions about appointments or follow up tests.

## 2020-04-28 NOTE — Anesthesia Procedure Notes (Signed)
Date/Time: 04/28/2020 11:11 AM Performed by: Cynda Familia, CRNA Oxygen Delivery Method: Simple face mask Placement Confirmation: positive ETCO2 and breath sounds checked- equal and bilateral Dental Injury: Teeth and Oropharynx as per pre-operative assessment

## 2020-04-28 NOTE — Anesthesia Postprocedure Evaluation (Signed)
Anesthesia Post Note  Patient: Lynn Alvarado  Procedure(s) Performed: ENDOSCOPIC RETROGRADE CHOLANGIOPANCREATOGRAPHY (ERCP) (N/A ) SPHINCTEROTOMY REMOVAL OF STONES     Patient location during evaluation: PACU Anesthesia Type: General Level of consciousness: awake and alert, oriented and patient cooperative Pain management: pain level controlled Vital Signs Assessment: post-procedure vital signs reviewed and stable Respiratory status: spontaneous breathing, nonlabored ventilation and respiratory function stable Cardiovascular status: blood pressure returned to baseline and stable Postop Assessment: no apparent nausea or vomiting Anesthetic complications: no   No complications documented.  Last Vitals:  Vitals:   04/28/20 1131 04/28/20 1135  BP: (!) 199/84   Pulse: (!) 143 78  Resp: (!) 22 (!) 25  Temp:    SpO2: 92% 97%    Last Pain:  Vitals:   04/28/20 1135  TempSrc:   PainSc: 0-No pain                 Pervis Hocking

## 2020-04-28 NOTE — Anesthesia Preprocedure Evaluation (Addendum)
Anesthesia Evaluation  Patient identified by MRN, date of birth, ID band Patient awake    Reviewed: Allergy & Precautions, NPO status , Patient's Chart, lab work & pertinent test results, reviewed documented beta blocker date and time   Airway Mallampati: I  TM Distance: >3 FB Neck ROM: Full    Dental no notable dental hx. (+) Missing, Poor Dentition, Dental Advisory Given,    Pulmonary neg pulmonary ROS,    Pulmonary exam normal breath sounds clear to auscultation       Cardiovascular hypertension, Pt. on medications and Pt. on home beta blockers Normal cardiovascular exam+ dysrhythmias (eliquis) Atrial Fibrillation  Rhythm:Regular Rate:Normal     Neuro/Psych PSYCHIATRIC DISORDERS Anxiety negative neurological ROS     GI/Hepatic negative GI ROS, CBD stones   Endo/Other  Hypothyroidism   Renal/GU negative Renal ROS  negative genitourinary   Musculoskeletal negative musculoskeletal ROS (+)   Abdominal Normal abdominal exam  (+)   Peds  Hematology negative hematology ROS (+)   Anesthesia Other Findings   Reproductive/Obstetrics negative OB ROS                            Anesthesia Physical Anesthesia Plan  ASA: III  Anesthesia Plan: General   Post-op Pain Management:    Induction: Intravenous  PONV Risk Score and Plan: Ondansetron, Dexamethasone and Treatment may vary due to age or medical condition  Airway Management Planned: Oral ETT  Additional Equipment: None  Intra-op Plan:   Post-operative Plan: Extubation in OR  Informed Consent: I have reviewed the patients History and Physical, chart, labs and discussed the procedure including the risks, benefits and alternatives for the proposed anesthesia with the patient or authorized representative who has indicated his/her understanding and acceptance.     Dental advisory given  Plan Discussed with: CRNA  Anesthesia Plan  Comments:        Anesthesia Quick Evaluation

## 2020-04-28 NOTE — Progress Notes (Signed)
Lynn Alvarado 9:57 AM  Subjective: Patient doing well specific complaints and we discussed her case with her daughter she has been losing weight and has elevated liver tests and her scans were reviewed  Objective: No signs stable afebrile no acute distress exam please see preassessment evaluation  Assessment: Significant CBD stones  Plan: Okay to proceed with ERCP with anesthesia assistance and the risks benefits methods and success rate were thoroughly discussed with the patient and her daughter and we answered all of their questions  [facility]  office [phone] After 5PM or if no answer call [phone]

## 2020-04-28 NOTE — Transfer of Care (Signed)
Immediate Anesthesia Transfer of Care Note  Patient: Lynn Alvarado  Procedure(s) Performed: ENDOSCOPIC RETROGRADE CHOLANGIOPANCREATOGRAPHY (ERCP) (N/A ) SPYGLASS LITHOTRIPSY (N/A )  Patient Location: PACU in Endo  Anesthesia Type:General  Level of Consciousness: awake  Airway & Oxygen Therapy: Patient Spontanous Breathing and Patient connected to face mask oxygen  Post-op Assessment: Report given to RN and Post -op Vital signs reviewed and stable  Post vital signs: Reviewed and stable  Last Vitals:  Vitals Value Taken Time  BP 201/91 04/28/20 1120  Temp    Pulse 77 04/28/20 1122  Resp 17 04/28/20 1122  SpO2 98 % 04/28/20 1122  Vitals shown include unvalidated device data.  Last Pain:  Vitals:   04/28/20 0919  TempSrc: Oral  PainSc: 0-No pain         Complications: No complications documented.

## 2020-04-28 NOTE — Anesthesia Procedure Notes (Signed)
Procedure Name: Intubation Date/Time: 04/28/2020 10:00 AM Performed by: Cynda Familia, CRNA Pre-anesthesia Checklist: Patient identified, Emergency Drugs available, Suction available and Patient being monitored Patient Re-evaluated:Patient Re-evaluated prior to induction Oxygen Delivery Method: Circle System Utilized Preoxygenation: Pre-oxygenation with 100% oxygen Induction Type: IV induction, Rapid sequence and Cricoid Pressure applied Ventilation: Mask ventilation without difficulty Laryngoscope Size: Miller and 2 Grade View: Grade I Tube type: Oral Tube size: 7.0 mm Number of attempts: 1 Airway Equipment and Method: Stylet Placement Confirmation: ETT inserted through vocal cords under direct vision,  positive ETCO2 and breath sounds checked- equal and bilateral Secured at: 22 cm Tube secured with: Tape Dental Injury: Teeth and Oropharynx as per pre-operative assessment  Comments: Smooth IV induction Finucaine-- intubation AM CRNA atraumatic-- teeth and mouth as preop- missing teeth unchanged with laryngoscopy  bilat BS as per MDA

## 2020-04-28 NOTE — Op Note (Signed)
[facility] Patient Name: Lynn Alvarado Procedure Date: 04/28/2020 MRN: [id number] Attending MD: Clarene Essex , MD Date of Birth: [birth year] CSN: [id number] Age: 86 Admit Type: Outpatient Procedure:                ERCP Indications:              For therapy of bile duct stone(s) Providers:                Clarene Essex, MD, Cleda Daub, RN, Cherylynn Ridges,                            Technician, Glenis Smoker, CRNA Referring MD:              Medicines:                General Anesthesia Complications:            No immediate complications. Estimated Blood Loss:     Estimated blood loss was minimal. Procedure:                Pre-Anesthesia Assessment:                           - Prior to the procedure, a History and Physical                            was performed, and patient medications and                            allergies were reviewed. The patient's tolerance of                            previous anesthesia was also reviewed. The risks                            and benefits of the procedure and the sedation                            options and risks were discussed with the patient.                            All questions were answered, and informed consent                            was obtained. Prior Anticoagulants: The patient has                            taken Eliquis (apixaban), last dose was 2 days                            prior to procedure. ASA Grade Assessment: III - A                            patient with severe systemic disease. After  reviewing the risks and benefits, the patient was                            deemed in satisfactory condition to undergo the                            procedure.                           After obtaining informed consent, the scope was                            passed under direct vision. Throughout the                            procedure, the patient's blood pressure, pulse, and                             oxygen saturations were monitored continuously. The                            [id number]-Q180V ([id number]) Olympus Duodensocope was                            introduced through the mouth, and used to inject                            contrast into and used to locate the major papilla.                            The ERCP was somewhat difficult. We did have a                            little difficulty passing the scope and there was                            some heme in her mouth and successful completion of                            the procedure was aided by performing the maneuvers                            documented (below) in this report. The patient                            tolerated the procedure well. Scope In: Scope Out: Findings:      The major papilla was normal. Deep selective cannulation was obtained on       the first attempt and the significant dilated duct with 2 large stones       were readily seen and biliary sphincterotomy was made with a Hydratome       sphincterotome using ERBE electrocautery. And we proceeded with the       sphincterotomy until we had adequate biliary drainage and could  get the       fully bowed sphincterotome easily in and out of the duct and there was       self limited oozing from the sphincterotomy site when we pulled the       larger balloon through but not at the time of sphincterotomy which did       not require treatment. Choledocholithiasis was found in a dilated duct.       To discover objects, the biliary tree was swept with an adjustable 15-       18 mm balloon starting at the bifurcation, left main hepatic duct and       right main hepatic duct. Initially some of the smaller distal sludge was       swept from the duct. All stones were removed at the end of the procedure       after additional sweeping and conical lithotripsy. Nothing was found at       the end of the procedure on multiple occlusion cholangiograms.        Mechanical lithotripsy with a 3 cm basket-type device was successful       breaking the larger stone into multiple pieces and was used multiple       times as was the balloon. Both the 15 and the 18 mm balloon passed       readily through the patent sphincterotomy site and there was no       pancreatic duct injection or wire advancement throughout the procedure       and the patient tolerated the procedure well and there was adequate       biliary drainage and there was no signs of bleeding at the end of the       procedure Impression:               - The major papilla appeared normal.                           - Choledocholithiasis was found. Complete removal                            was accomplished by biliary sphincterotomy and                            balloon extraction and mechanical lithotripsy as                            above.                           - A biliary sphincterotomy was performed.                           - The biliary tree was swept and nothing was found                            at the end of the procedure.                           - Lithotripsy was successful. Moderate Sedation:      Not Applicable - Patient had care per Anesthesia. Recommendation:           -  Clear liquid diet for 6 hours. If doing well at 6                            PM may have soft solids                           - Resume Eliquis (apixaban) at prior dose in 2 days                            if no signs of GI bleeding.                           - Return to GI clinic in 3-4 weeks.                           - Check liver enzymes (AST, ALT, alkaline                            phosphatase, bilirubin) in 1 -2week.                           - Telephone GI clinic if symptomatic PRN. Procedure Code(s):        --- Professional ---                           [phone], Esophagogastroduodenoscopy, flexible,                            transoral; diagnostic, including collection of                             specimen(s) by brushing or washing, when performed                            (separate procedure) Diagnosis Code(s):        --- Professional ---                           K80.50, Calculus of bile duct without cholangitis                            or cholecystitis without obstruction CPT copyright 2019 American Medical Association. All rights reserved. The codes documented in this report are preliminary and upon coder review may  be revised to meet current compliance requirements. Clarene Essex, MD 04/28/2020 11:27:16 AM This report has been signed electronically. Number of Addenda: 0

## 2020-04-29 ENCOUNTER — Encounter (HOSPITAL_COMMUNITY): Payer: Self-pay | Admitting: Gastroenterology

## 2020-05-05 DIAGNOSIS — R748 Abnormal levels of other serum enzymes: Secondary | ICD-10-CM | POA: Diagnosis not present

## 2020-06-29 ENCOUNTER — Other Ambulatory Visit (HOSPITAL_COMMUNITY): Payer: Self-pay | Admitting: Nurse Practitioner

## 2020-09-07 DIAGNOSIS — N183 Chronic kidney disease, stage 3 unspecified: Secondary | ICD-10-CM | POA: Diagnosis not present

## 2020-09-07 DIAGNOSIS — B351 Tinea unguium: Secondary | ICD-10-CM | POA: Diagnosis not present

## 2020-09-07 DIAGNOSIS — E1169 Type 2 diabetes mellitus with other specified complication: Secondary | ICD-10-CM | POA: Diagnosis not present

## 2020-09-07 DIAGNOSIS — E039 Hypothyroidism, unspecified: Secondary | ICD-10-CM | POA: Diagnosis not present

## 2020-09-07 DIAGNOSIS — I129 Hypertensive chronic kidney disease with stage 1 through stage 4 chronic kidney disease, or unspecified chronic kidney disease: Secondary | ICD-10-CM | POA: Diagnosis not present

## 2020-09-07 DIAGNOSIS — F411 Generalized anxiety disorder: Secondary | ICD-10-CM | POA: Diagnosis not present

## 2020-09-07 DIAGNOSIS — R634 Abnormal weight loss: Secondary | ICD-10-CM | POA: Diagnosis not present

## 2020-09-07 DIAGNOSIS — L989 Disorder of the skin and subcutaneous tissue, unspecified: Secondary | ICD-10-CM | POA: Diagnosis not present

## 2020-09-07 DIAGNOSIS — E782 Mixed hyperlipidemia: Secondary | ICD-10-CM | POA: Diagnosis not present

## 2020-11-14 ENCOUNTER — Other Ambulatory Visit (HOSPITAL_COMMUNITY): Payer: Self-pay | Admitting: Nurse Practitioner

## 2021-02-16 DIAGNOSIS — Z961 Presence of intraocular lens: Secondary | ICD-10-CM | POA: Diagnosis not present

## 2021-03-08 DIAGNOSIS — J301 Allergic rhinitis due to pollen: Secondary | ICD-10-CM | POA: Diagnosis not present

## 2021-03-08 DIAGNOSIS — I129 Hypertensive chronic kidney disease with stage 1 through stage 4 chronic kidney disease, or unspecified chronic kidney disease: Secondary | ICD-10-CM | POA: Diagnosis not present

## 2021-03-08 DIAGNOSIS — F3341 Major depressive disorder, recurrent, in partial remission: Secondary | ICD-10-CM | POA: Diagnosis not present

## 2021-03-08 DIAGNOSIS — F411 Generalized anxiety disorder: Secondary | ICD-10-CM | POA: Diagnosis not present

## 2021-03-08 DIAGNOSIS — D6869 Other thrombophilia: Secondary | ICD-10-CM | POA: Diagnosis not present

## 2021-03-08 DIAGNOSIS — E039 Hypothyroidism, unspecified: Secondary | ICD-10-CM | POA: Diagnosis not present

## 2021-03-08 DIAGNOSIS — Z0000 Encounter for general adult medical examination without abnormal findings: Secondary | ICD-10-CM | POA: Diagnosis not present

## 2021-03-08 DIAGNOSIS — I4891 Unspecified atrial fibrillation: Secondary | ICD-10-CM | POA: Diagnosis not present

## 2021-03-08 DIAGNOSIS — E559 Vitamin D deficiency, unspecified: Secondary | ICD-10-CM | POA: Diagnosis not present

## 2021-03-08 DIAGNOSIS — E1169 Type 2 diabetes mellitus with other specified complication: Secondary | ICD-10-CM | POA: Diagnosis not present

## 2021-03-08 DIAGNOSIS — Z23 Encounter for immunization: Secondary | ICD-10-CM | POA: Diagnosis not present

## 2021-03-08 DIAGNOSIS — E782 Mixed hyperlipidemia: Secondary | ICD-10-CM | POA: Diagnosis not present

## 2021-09-07 DIAGNOSIS — I4891 Unspecified atrial fibrillation: Secondary | ICD-10-CM | POA: Diagnosis not present

## 2021-09-07 DIAGNOSIS — E039 Hypothyroidism, unspecified: Secondary | ICD-10-CM | POA: Diagnosis not present

## 2021-09-07 DIAGNOSIS — N183 Chronic kidney disease, stage 3 unspecified: Secondary | ICD-10-CM | POA: Diagnosis not present

## 2021-09-07 DIAGNOSIS — I129 Hypertensive chronic kidney disease with stage 1 through stage 4 chronic kidney disease, or unspecified chronic kidney disease: Secondary | ICD-10-CM | POA: Diagnosis not present

## 2021-09-07 DIAGNOSIS — E782 Mixed hyperlipidemia: Secondary | ICD-10-CM | POA: Diagnosis not present

## 2021-09-07 DIAGNOSIS — E1169 Type 2 diabetes mellitus with other specified complication: Secondary | ICD-10-CM | POA: Diagnosis not present

## 2021-09-07 DIAGNOSIS — D6869 Other thrombophilia: Secondary | ICD-10-CM | POA: Diagnosis not present

## 2021-09-07 DIAGNOSIS — E46 Unspecified protein-calorie malnutrition: Secondary | ICD-10-CM | POA: Diagnosis not present

## 2021-10-09 DIAGNOSIS — I10 Essential (primary) hypertension: Secondary | ICD-10-CM | POA: Diagnosis not present

## 2021-10-09 DIAGNOSIS — H6123 Impacted cerumen, bilateral: Secondary | ICD-10-CM | POA: Diagnosis not present

## 2021-10-09 DIAGNOSIS — Z789 Other specified health status: Secondary | ICD-10-CM | POA: Diagnosis not present

## 2022-03-19 DIAGNOSIS — E782 Mixed hyperlipidemia: Secondary | ICD-10-CM | POA: Diagnosis not present

## 2022-03-19 DIAGNOSIS — I4891 Unspecified atrial fibrillation: Secondary | ICD-10-CM | POA: Diagnosis not present

## 2022-03-19 DIAGNOSIS — F3340 Major depressive disorder, recurrent, in remission, unspecified: Secondary | ICD-10-CM | POA: Diagnosis not present

## 2022-03-19 DIAGNOSIS — Z0000 Encounter for general adult medical examination without abnormal findings: Secondary | ICD-10-CM | POA: Diagnosis not present

## 2022-03-19 DIAGNOSIS — D6869 Other thrombophilia: Secondary | ICD-10-CM | POA: Diagnosis not present

## 2022-03-19 DIAGNOSIS — F411 Generalized anxiety disorder: Secondary | ICD-10-CM | POA: Diagnosis not present

## 2022-03-19 DIAGNOSIS — I129 Hypertensive chronic kidney disease with stage 1 through stage 4 chronic kidney disease, or unspecified chronic kidney disease: Secondary | ICD-10-CM | POA: Diagnosis not present

## 2022-03-19 DIAGNOSIS — E1169 Type 2 diabetes mellitus with other specified complication: Secondary | ICD-10-CM | POA: Diagnosis not present

## 2022-03-19 DIAGNOSIS — Z23 Encounter for immunization: Secondary | ICD-10-CM | POA: Diagnosis not present

## 2022-03-19 DIAGNOSIS — N183 Chronic kidney disease, stage 3 unspecified: Secondary | ICD-10-CM | POA: Diagnosis not present

## 2022-03-19 DIAGNOSIS — E039 Hypothyroidism, unspecified: Secondary | ICD-10-CM | POA: Diagnosis not present

## 2022-03-19 DIAGNOSIS — E559 Vitamin D deficiency, unspecified: Secondary | ICD-10-CM | POA: Diagnosis not present

## 2022-05-03 DIAGNOSIS — E039 Hypothyroidism, unspecified: Secondary | ICD-10-CM | POA: Diagnosis not present

## 2022-08-16 ENCOUNTER — Encounter (HOSPITAL_COMMUNITY): Payer: Self-pay | Admitting: *Deleted

## 2022-09-17 DIAGNOSIS — N183 Chronic kidney disease, stage 3 unspecified: Secondary | ICD-10-CM | POA: Diagnosis not present

## 2022-09-17 DIAGNOSIS — E1122 Type 2 diabetes mellitus with diabetic chronic kidney disease: Secondary | ICD-10-CM | POA: Diagnosis not present

## 2022-09-17 DIAGNOSIS — I129 Hypertensive chronic kidney disease with stage 1 through stage 4 chronic kidney disease, or unspecified chronic kidney disease: Secondary | ICD-10-CM | POA: Diagnosis not present

## 2022-09-17 DIAGNOSIS — L84 Corns and callosities: Secondary | ICD-10-CM | POA: Diagnosis not present

## 2022-09-17 DIAGNOSIS — F411 Generalized anxiety disorder: Secondary | ICD-10-CM | POA: Diagnosis not present

## 2022-09-17 DIAGNOSIS — I4891 Unspecified atrial fibrillation: Secondary | ICD-10-CM | POA: Diagnosis not present

## 2022-09-17 DIAGNOSIS — E039 Hypothyroidism, unspecified: Secondary | ICD-10-CM | POA: Diagnosis not present

## 2022-09-17 DIAGNOSIS — R221 Localized swelling, mass and lump, neck: Secondary | ICD-10-CM | POA: Diagnosis not present

## 2022-09-17 DIAGNOSIS — E782 Mixed hyperlipidemia: Secondary | ICD-10-CM | POA: Diagnosis not present

## 2022-12-06 DIAGNOSIS — H9190 Unspecified hearing loss, unspecified ear: Secondary | ICD-10-CM | POA: Diagnosis not present

## 2023-03-26 DIAGNOSIS — E039 Hypothyroidism, unspecified: Secondary | ICD-10-CM | POA: Diagnosis not present

## 2023-03-26 DIAGNOSIS — E1122 Type 2 diabetes mellitus with diabetic chronic kidney disease: Secondary | ICD-10-CM | POA: Diagnosis not present

## 2023-03-26 DIAGNOSIS — E559 Vitamin D deficiency, unspecified: Secondary | ICD-10-CM | POA: Diagnosis not present

## 2023-03-26 DIAGNOSIS — N183 Chronic kidney disease, stage 3 unspecified: Secondary | ICD-10-CM | POA: Diagnosis not present

## 2023-03-26 DIAGNOSIS — Z0000 Encounter for general adult medical examination without abnormal findings: Secondary | ICD-10-CM | POA: Diagnosis not present

## 2023-03-26 DIAGNOSIS — D6869 Other thrombophilia: Secondary | ICD-10-CM | POA: Diagnosis not present

## 2023-03-26 DIAGNOSIS — F411 Generalized anxiety disorder: Secondary | ICD-10-CM | POA: Diagnosis not present

## 2023-03-26 DIAGNOSIS — I129 Hypertensive chronic kidney disease with stage 1 through stage 4 chronic kidney disease, or unspecified chronic kidney disease: Secondary | ICD-10-CM | POA: Diagnosis not present

## 2023-03-26 DIAGNOSIS — Z23 Encounter for immunization: Secondary | ICD-10-CM | POA: Diagnosis not present

## 2023-03-26 DIAGNOSIS — F3340 Major depressive disorder, recurrent, in remission, unspecified: Secondary | ICD-10-CM | POA: Diagnosis not present

## 2023-03-26 DIAGNOSIS — I4891 Unspecified atrial fibrillation: Secondary | ICD-10-CM | POA: Diagnosis not present

## 2023-03-26 DIAGNOSIS — E782 Mixed hyperlipidemia: Secondary | ICD-10-CM | POA: Diagnosis not present

## 2023-09-24 DIAGNOSIS — I4891 Unspecified atrial fibrillation: Secondary | ICD-10-CM | POA: Diagnosis not present

## 2023-09-24 DIAGNOSIS — F411 Generalized anxiety disorder: Secondary | ICD-10-CM | POA: Diagnosis not present

## 2023-09-24 DIAGNOSIS — L84 Corns and callosities: Secondary | ICD-10-CM | POA: Diagnosis not present

## 2023-09-24 DIAGNOSIS — E782 Mixed hyperlipidemia: Secondary | ICD-10-CM | POA: Diagnosis not present

## 2023-09-24 DIAGNOSIS — N183 Chronic kidney disease, stage 3 unspecified: Secondary | ICD-10-CM | POA: Diagnosis not present

## 2023-09-24 DIAGNOSIS — E039 Hypothyroidism, unspecified: Secondary | ICD-10-CM | POA: Diagnosis not present

## 2023-09-24 DIAGNOSIS — R600 Localized edema: Secondary | ICD-10-CM | POA: Diagnosis not present

## 2023-09-24 DIAGNOSIS — I129 Hypertensive chronic kidney disease with stage 1 through stage 4 chronic kidney disease, or unspecified chronic kidney disease: Secondary | ICD-10-CM | POA: Diagnosis not present

## 2023-09-24 DIAGNOSIS — E1122 Type 2 diabetes mellitus with diabetic chronic kidney disease: Secondary | ICD-10-CM | POA: Diagnosis not present

## 2023-10-22 DIAGNOSIS — R600 Localized edema: Secondary | ICD-10-CM | POA: Diagnosis not present

## 2023-10-22 DIAGNOSIS — W19XXXA Unspecified fall, initial encounter: Secondary | ICD-10-CM | POA: Diagnosis not present

## 2023-10-22 DIAGNOSIS — R413 Other amnesia: Secondary | ICD-10-CM | POA: Diagnosis not present

## 2023-10-22 DIAGNOSIS — R350 Frequency of micturition: Secondary | ICD-10-CM | POA: Diagnosis not present

## 2023-10-22 DIAGNOSIS — E1169 Type 2 diabetes mellitus with other specified complication: Secondary | ICD-10-CM | POA: Diagnosis not present

## 2023-10-22 DIAGNOSIS — I129 Hypertensive chronic kidney disease with stage 1 through stage 4 chronic kidney disease, or unspecified chronic kidney disease: Secondary | ICD-10-CM | POA: Diagnosis not present

## 2023-10-22 DIAGNOSIS — I4891 Unspecified atrial fibrillation: Secondary | ICD-10-CM | POA: Diagnosis not present

## 2023-10-22 DIAGNOSIS — D6869 Other thrombophilia: Secondary | ICD-10-CM | POA: Diagnosis not present

## 2023-10-22 DIAGNOSIS — N183 Chronic kidney disease, stage 3 unspecified: Secondary | ICD-10-CM | POA: Diagnosis not present

## 2023-11-06 DIAGNOSIS — L84 Corns and callosities: Secondary | ICD-10-CM | POA: Diagnosis not present

## 2023-11-06 DIAGNOSIS — R600 Localized edema: Secondary | ICD-10-CM | POA: Diagnosis not present

## 2023-11-06 DIAGNOSIS — F411 Generalized anxiety disorder: Secondary | ICD-10-CM | POA: Diagnosis not present

## 2023-11-18 ENCOUNTER — Encounter: Payer: Self-pay | Admitting: Podiatry

## 2023-11-18 ENCOUNTER — Ambulatory Visit (INDEPENDENT_AMBULATORY_CARE_PROVIDER_SITE_OTHER): Admitting: Podiatry

## 2023-11-18 DIAGNOSIS — L84 Corns and callosities: Secondary | ICD-10-CM | POA: Diagnosis not present

## 2023-11-18 DIAGNOSIS — M79674 Pain in right toe(s): Secondary | ICD-10-CM | POA: Diagnosis not present

## 2023-11-18 DIAGNOSIS — M79675 Pain in left toe(s): Secondary | ICD-10-CM | POA: Diagnosis not present

## 2023-11-18 DIAGNOSIS — B351 Tinea unguium: Secondary | ICD-10-CM | POA: Diagnosis not present

## 2023-11-18 DIAGNOSIS — I739 Peripheral vascular disease, unspecified: Secondary | ICD-10-CM
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
## 2024-03-02 ENCOUNTER — Ambulatory Visit: Admitting: Podiatry

## 2024-03-26 DIAGNOSIS — F3340 Major depressive disorder, recurrent, in remission, unspecified: Secondary | ICD-10-CM | POA: Diagnosis not present

## 2024-03-26 DIAGNOSIS — Z1331 Encounter for screening for depression: Secondary | ICD-10-CM | POA: Diagnosis not present

## 2024-03-26 DIAGNOSIS — E1122 Type 2 diabetes mellitus with diabetic chronic kidney disease: Secondary | ICD-10-CM | POA: Diagnosis not present

## 2024-03-26 DIAGNOSIS — E782 Mixed hyperlipidemia: Secondary | ICD-10-CM | POA: Diagnosis not present

## 2024-03-26 DIAGNOSIS — I129 Hypertensive chronic kidney disease with stage 1 through stage 4 chronic kidney disease, or unspecified chronic kidney disease: Secondary | ICD-10-CM | POA: Diagnosis not present

## 2024-03-26 DIAGNOSIS — M8580 Other specified disorders of bone density and structure, unspecified site: Secondary | ICD-10-CM | POA: Diagnosis not present

## 2024-03-26 DIAGNOSIS — Z0000 Encounter for general adult medical examination without abnormal findings: Secondary | ICD-10-CM | POA: Diagnosis not present

## 2024-03-26 DIAGNOSIS — F411 Generalized anxiety disorder: Secondary | ICD-10-CM | POA: Diagnosis not present

## 2024-03-26 DIAGNOSIS — Z23 Encounter for immunization: Secondary | ICD-10-CM | POA: Diagnosis not present

## 2024-03-26 DIAGNOSIS — E039 Hypothyroidism, unspecified: Secondary | ICD-10-CM | POA: Diagnosis not present

## 2024-03-26 DIAGNOSIS — N183 Chronic kidney disease, stage 3 unspecified: Secondary | ICD-10-CM | POA: Diagnosis not present

## 2024-03-26 DIAGNOSIS — I4891 Unspecified atrial fibrillation: Secondary | ICD-10-CM | POA: Diagnosis not present

## 2024-04-24 DIAGNOSIS — R32 Unspecified urinary incontinence: Secondary | ICD-10-CM | POA: Diagnosis not present

## 2024-04-24 DIAGNOSIS — R300 Dysuria: Secondary | ICD-10-CM | POA: Diagnosis not present

## 2024-06-07 ENCOUNTER — Emergency Department (HOSPITAL_COMMUNITY)

## 2024-06-07 ENCOUNTER — Other Ambulatory Visit: Payer: Self-pay

## 2024-06-07 ENCOUNTER — Observation Stay (HOSPITAL_COMMUNITY)
Admission: EM | Admit: 2024-06-07 | Discharge: 2024-06-12 | DRG: 605 | Disposition: A | Discharge status: Skilled Nursing Facility

## 2024-06-07 ENCOUNTER — Encounter (HOSPITAL_COMMUNITY): Payer: Self-pay

## 2024-06-07 DIAGNOSIS — M6282 Rhabdomyolysis: Secondary | ICD-10-CM | POA: Insufficient documentation

## 2024-06-07 DIAGNOSIS — E871 Hypo-osmolality and hyponatremia: Secondary | ICD-10-CM | POA: Diagnosis present

## 2024-06-07 DIAGNOSIS — D62 Acute posthemorrhagic anemia: Secondary | ICD-10-CM | POA: Diagnosis present

## 2024-06-07 DIAGNOSIS — Z833 Family history of diabetes mellitus: Secondary | ICD-10-CM | POA: Diagnosis not present

## 2024-06-07 DIAGNOSIS — E7800 Pure hypercholesterolemia, unspecified: Secondary | ICD-10-CM | POA: Diagnosis present

## 2024-06-07 DIAGNOSIS — I10 Essential (primary) hypertension: Secondary | ICD-10-CM | POA: Diagnosis present

## 2024-06-07 DIAGNOSIS — I482 Chronic atrial fibrillation, unspecified: Secondary | ICD-10-CM | POA: Diagnosis present

## 2024-06-07 DIAGNOSIS — F419 Anxiety disorder, unspecified: Secondary | ICD-10-CM | POA: Diagnosis present

## 2024-06-07 DIAGNOSIS — I499 Cardiac arrhythmia, unspecified: Secondary | ICD-10-CM | POA: Diagnosis not present

## 2024-06-07 DIAGNOSIS — S20212A Contusion of left front wall of thorax, initial encounter: Principal | ICD-10-CM | POA: Insufficient documentation

## 2024-06-07 DIAGNOSIS — Z8249 Family history of ischemic heart disease and other diseases of the circulatory system: Secondary | ICD-10-CM | POA: Diagnosis not present

## 2024-06-07 DIAGNOSIS — T07XXXA Unspecified multiple injuries, initial encounter: Secondary | ICD-10-CM | POA: Diagnosis not present

## 2024-06-07 DIAGNOSIS — S2002XA Contusion of left breast, initial encounter: Secondary | ICD-10-CM | POA: Diagnosis not present

## 2024-06-07 DIAGNOSIS — M47814 Spondylosis without myelopathy or radiculopathy, thoracic region: Secondary | ICD-10-CM | POA: Diagnosis not present

## 2024-06-07 DIAGNOSIS — Z79899 Other long term (current) drug therapy: Secondary | ICD-10-CM | POA: Diagnosis not present

## 2024-06-07 DIAGNOSIS — R609 Edema, unspecified: Secondary | ICD-10-CM | POA: Diagnosis not present

## 2024-06-07 DIAGNOSIS — R748 Abnormal levels of other serum enzymes: Secondary | ICD-10-CM

## 2024-06-07 DIAGNOSIS — R739 Hyperglycemia, unspecified: Secondary | ICD-10-CM | POA: Diagnosis present

## 2024-06-07 DIAGNOSIS — E861 Hypovolemia: Secondary | ICD-10-CM | POA: Diagnosis present

## 2024-06-07 DIAGNOSIS — M47816 Spondylosis without myelopathy or radiculopathy, lumbar region: Secondary | ICD-10-CM | POA: Diagnosis not present

## 2024-06-07 DIAGNOSIS — Z9049 Acquired absence of other specified parts of digestive tract: Secondary | ICD-10-CM | POA: Diagnosis not present

## 2024-06-07 DIAGNOSIS — E039 Hypothyroidism, unspecified: Secondary | ICD-10-CM | POA: Diagnosis present

## 2024-06-07 DIAGNOSIS — W19XXXA Unspecified fall, initial encounter: Secondary | ICD-10-CM

## 2024-06-07 DIAGNOSIS — E872 Acidosis, unspecified: Secondary | ICD-10-CM | POA: Diagnosis present

## 2024-06-07 DIAGNOSIS — Z7901 Long term (current) use of anticoagulants: Secondary | ICD-10-CM | POA: Diagnosis not present

## 2024-06-07 DIAGNOSIS — I959 Hypotension, unspecified: Secondary | ICD-10-CM | POA: Diagnosis not present

## 2024-06-07 DIAGNOSIS — S20213A Contusion of bilateral front wall of thorax, initial encounter: Secondary | ICD-10-CM | POA: Diagnosis present

## 2024-06-07 DIAGNOSIS — S40022A Contusion of left upper arm, initial encounter: Secondary | ICD-10-CM | POA: Diagnosis present

## 2024-06-07 DIAGNOSIS — Z79890 Hormone replacement therapy (postmenopausal): Secondary | ICD-10-CM | POA: Diagnosis not present

## 2024-06-07 DIAGNOSIS — T796XXA Traumatic ischemia of muscle, initial encounter: Secondary | ICD-10-CM | POA: Diagnosis not present

## 2024-06-07 DIAGNOSIS — R109 Unspecified abdominal pain: Secondary | ICD-10-CM | POA: Diagnosis not present

## 2024-06-07 DIAGNOSIS — I4891 Unspecified atrial fibrillation: Secondary | ICD-10-CM | POA: Diagnosis present

## 2024-06-07 DIAGNOSIS — E038 Other specified hypothyroidism: Secondary | ICD-10-CM | POA: Diagnosis not present

## 2024-06-07 DIAGNOSIS — R296 Repeated falls: Secondary | ICD-10-CM | POA: Diagnosis present

## 2024-06-07 DIAGNOSIS — Z9181 History of falling: Secondary | ICD-10-CM | POA: Diagnosis not present

## 2024-06-07 DIAGNOSIS — D6832 Hemorrhagic disorder due to extrinsic circulating anticoagulants: Secondary | ICD-10-CM | POA: Diagnosis present

## 2024-06-07 DIAGNOSIS — R7989 Other specified abnormal findings of blood chemistry: Secondary | ICD-10-CM

## 2024-06-07 DIAGNOSIS — R000 Tachycardia, unspecified: Secondary | ICD-10-CM | POA: Diagnosis not present

## 2024-06-07 DIAGNOSIS — T45515A Adverse effect of anticoagulants, initial encounter: Secondary | ICD-10-CM | POA: Diagnosis present

## 2024-06-07 DIAGNOSIS — Y92012 Bathroom of single-family (private) house as the place of occurrence of the external cause: Secondary | ICD-10-CM | POA: Diagnosis not present

## 2024-06-07 DIAGNOSIS — Z66 Do not resuscitate: Secondary | ICD-10-CM | POA: Diagnosis present

## 2024-06-07 LAB — IRON AND TIBC
Iron: 33 ug/dL (ref 28–170)
Saturation Ratios: 12 % (ref 10.4–31.8)
TIBC: 267 ug/dL (ref 250–450)
UIBC: 234 ug/dL

## 2024-06-07 LAB — COMPREHENSIVE METABOLIC PANEL WITH GFR
ALT: 20 U/L (ref 0–44)
AST: 46 U/L — ABNORMAL HIGH (ref 15–41)
Albumin: 2.9 g/dL — ABNORMAL LOW (ref 3.5–5.0)
Alkaline Phosphatase: 74 U/L (ref 38–126)
Anion gap: 12 (ref 5–15)
BUN: 25 mg/dL — ABNORMAL HIGH (ref 8–23)
CO2: 22 mmol/L (ref 22–32)
Calcium: 8.6 mg/dL — ABNORMAL LOW (ref 8.9–10.3)
Chloride: 98 mmol/L (ref 98–111)
Creatinine, Ser: 1 mg/dL (ref 0.44–1.00)
GFR, Estimated: 51 mL/min — ABNORMAL LOW (ref >60)
Glucose, Bld: 208 mg/dL — ABNORMAL HIGH (ref 70–99)
Potassium: 4.6 mmol/L (ref 3.5–5.1)
Sodium: 132 mmol/L — ABNORMAL LOW (ref 135–145)
Total Bilirubin: 1.9 mg/dL — ABNORMAL HIGH (ref 0.0–1.2)
Total Protein: 6.2 g/dL — ABNORMAL LOW (ref 6.5–8.1)

## 2024-06-07 LAB — I-STAT CHEM 8, ED
BUN: 27 mg/dL — ABNORMAL HIGH (ref 8–23)
Calcium, Ion: 1.1 mmol/L — ABNORMAL LOW (ref 1.15–1.40)
Chloride: 94 mmol/L — ABNORMAL LOW (ref 98–111)
Creatinine, Ser: 1 mg/dL (ref 0.44–1.00)
Glucose, Bld: 201 mg/dL — ABNORMAL HIGH (ref 70–99)
HCT: 34 % — ABNORMAL LOW (ref 36.0–46.0)
Hemoglobin: 11.6 g/dL — ABNORMAL LOW (ref 12.0–15.0)
Potassium: 4.5 mmol/L (ref 3.5–5.1)
Sodium: 134 mmol/L — ABNORMAL LOW (ref 135–145)
TCO2: 24 mmol/L (ref 22–32)

## 2024-06-07 LAB — VITAMIN B12: Vitamin B-12: 378 pg/mL (ref 180–914)

## 2024-06-07 LAB — ETHANOL: Alcohol, Ethyl (B): <15 mg/dL (ref <15)

## 2024-06-07 LAB — LACTIC ACID, PLASMA
Lactic Acid, Venous: 2.5 mmol/L (ref 0.5–1.9)
Lactic Acid, Venous: 2.3 mmol/L (ref 0.5–1.9)

## 2024-06-07 LAB — CBC
HCT: 33.4 % — ABNORMAL LOW (ref 36.0–46.0)
Hemoglobin: 10.5 g/dL — ABNORMAL LOW (ref 12.0–15.0)
MCH: 28.8 pg (ref 26.0–34.0)
MCHC: 31.4 g/dL (ref 30.0–36.0)
MCV: 91.5 fL (ref 80.0–100.0)
Platelets: 175 K/uL (ref 150–400)
RBC: 3.65 MIL/uL — ABNORMAL LOW (ref 3.87–5.11)
RDW: 14.6 % (ref 11.5–15.5)
WBC: 8.6 K/uL (ref 4.0–10.5)
nRBC: 0 % (ref 0.0–0.2)

## 2024-06-07 LAB — FOLATE: Folate: >20 ng/mL (ref >5.9)

## 2024-06-07 LAB — RETICULOCYTES
Immature Retic Fract: 13 % (ref 2.3–15.9)
RBC.: 2.97 MIL/uL — ABNORMAL LOW (ref 3.87–5.11)
Retic Count, Absolute: 40.7 K/uL (ref 19.0–186.0)
Retic Ct Pct: 1.4 % (ref 0.4–3.1)

## 2024-06-07 LAB — FERRITIN: Ferritin: 30 ng/mL (ref 11–307)

## 2024-06-07 LAB — CK TOTAL AND CKMB (NOT AT ARMC)
CK, MB: 19.3 ng/mL — ABNORMAL HIGH (ref 0.5–5.0)
Total CK: 546 U/L — ABNORMAL HIGH (ref 38–234)

## 2024-06-07 LAB — PROTIME-INR
INR: 1.7 — ABNORMAL HIGH (ref 0.8–1.2)
Prothrombin Time: 20.7 s — ABNORMAL HIGH (ref 11.4–15.2)

## 2024-06-07 LAB — I-STAT CG4 LACTIC ACID, ED: Lactic Acid, Venous: 4.9 mmol/L (ref 0.5–1.9)

## 2024-06-07 LAB — SAMPLE TO BLOOD BANK

## 2024-06-07 MED ORDER — PRAVASTATIN SODIUM 40 MG PO TABS
40.0000 mg | ORAL_TABLET | Freq: Every day | ORAL | Status: DC
  Administered 2024-06-08 – 2024-06-12 (×5): 40 mg via ORAL
  Filled 2024-06-07 (×5): qty 1

## 2024-06-07 MED ORDER — ONDANSETRON HCL 4 MG/2ML IJ SOLN: 4.0000 mg | Freq: Four times a day (QID) | INTRAMUSCULAR | Status: DC | PRN

## 2024-06-07 MED ORDER — LEVOTHYROXINE SODIUM 25 MCG PO TABS
25.0000 ug | ORAL_TABLET | Freq: Every day | ORAL | Status: DC
  Administered 2024-06-09 – 2024-06-12 (×4): 25 ug via ORAL
  Filled 2024-06-07 (×4): qty 1

## 2024-06-07 MED ORDER — ONDANSETRON HCL 4 MG PO TABS: 4.0000 mg | ORAL_TABLET | Freq: Four times a day (QID) | ORAL | Status: DC | PRN

## 2024-06-07 MED ORDER — BISOPROLOL FUMARATE 5 MG PO TABS
5.0000 mg | ORAL_TABLET | Freq: Every day | ORAL | Status: DC
  Administered 2024-06-07 – 2024-06-12 (×6): 5 mg via ORAL
  Filled 2024-06-07 (×6): qty 1

## 2024-06-07 MED ORDER — SODIUM CHLORIDE 0.9% FLUSH
3.0000 mL | Freq: Two times a day (BID) | INTRAVENOUS | Status: DC
  Administered 2024-06-07 – 2024-06-12 (×10): 3 mL via INTRAVENOUS

## 2024-06-07 MED ORDER — SODIUM CHLORIDE 0.9 % IV SOLN: INTRAVENOUS | Status: DC

## 2024-06-07 MED ORDER — ADULT MULTIVITAMIN W/MINERALS CH
1.0000 | ORAL_TABLET | Freq: Every day | ORAL | Status: DC
  Administered 2024-06-07 – 2024-06-12 (×6): 1 via ORAL
  Filled 2024-06-07 (×6): qty 1

## 2024-06-07 MED ORDER — ACETAMINOPHEN 325 MG PO TABS
650.0000 mg | ORAL_TABLET | Freq: Four times a day (QID) | ORAL | Status: DC | PRN
  Administered 2024-06-07 – 2024-06-11 (×6): 650 mg via ORAL
  Filled 2024-06-07 (×6): qty 2

## 2024-06-07 MED ORDER — LORAZEPAM 0.5 MG PO TABS
0.5000 mg | ORAL_TABLET | Freq: Two times a day (BID) | ORAL | Status: DC | PRN
  Administered 2024-06-11: 1 mg via ORAL
  Filled 2024-06-07: qty 2

## 2024-06-07 MED ORDER — LACTATED RINGERS IV BOLUS
1000.0000 mL | Freq: Once | INTRAVENOUS | Status: AC
  Administered 2024-06-07: 1000 mL via INTRAVENOUS

## 2024-06-07 MED ORDER — ACETAMINOPHEN 650 MG RE SUPP: 650.0000 mg | Freq: Four times a day (QID) | RECTAL | Status: DC | PRN

## 2024-06-07 MED ORDER — SERTRALINE HCL 25 MG PO TABS
50.0000 mg | ORAL_TABLET | Freq: Every day | ORAL | Status: DC
  Administered 2024-06-07 – 2024-06-10 (×4): 50 mg via ORAL
  Filled 2024-06-07 (×4): qty 2

## 2024-06-07 MED ORDER — IOHEXOL 350 MG/ML SOLN
75.0000 mL | Freq: Once | INTRAVENOUS | Status: AC | PRN
  Administered 2024-06-07: 75 mL via INTRAVENOUS

## 2024-06-07 NOTE — ED Triage Notes (Signed)
 BIB EMS  from home r/t Fall on thinners. Patient found in bathroom at home today at 11am. LKNW 6pm yesterday by home health. Noted bruising to right chest and upper arm. C-collar intact. Hx dementia. A&Ox2.  On eliquis .

## 2024-06-07 NOTE — Plan of Care (Signed)
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
## 2024-06-07 NOTE — ED Provider Notes (Signed)
 [facility] Alvar[facility] EMERGENCY DEPARTMENT AT [facility] Provider Note   CSN: [id number] Arrival date & time: 06/07/24  1228     Patient presents with: Level 2 Fall   [address] Lynn Alvarado is a 89 y.o. female.   89 year old female arrives by EMS as a level 2 trauma alert.  She reportedly had a fall.  Last known normal at 6 PM last night was found on the floor in her shower at 11 AM today.  Patient with significant bruising to the left side of her chest and left upper arm.  She is reportedly on Eliquis  and reportedly at her mental status baseline.  She is a caregiver that comes during the day, that is who found her this morning.  Patient and family bedside deny any other symptoms or concerns over the last few days.        Prior to Admission medications   Medication Sig Start Date End Date Taking? Authorizing Provider  apixaban  (ELIQUIS ) 5 MG TABS tablet Take 1 tablet (5 mg total) by mouth 2 (two) times daily. Appointment Required For Further Refills [phone] 06/29/20   Dow Arland BROCKS, NP  bisoprolol -hydrochlorothiazide  (ZIAC ) 5-6.25 MG tablet Take 1 tablet by mouth daily.    [provider]  levothyroxine  (SYNTHROID , LEVOTHROID) 25 MCG tablet Take 25 mcg by mouth daily. 02/10/18   [provider]  LORazepam  (ATIVAN ) 1 MG tablet Take 0.5-1 mg by mouth 2 (two) times daily as needed for anxiety. 03/07/20   [provider]  Multiple Vitamins-Minerals (MULTIVITAMIN WITH MINERALS) tablet Take 1 tablet by mouth daily.    [provider]  Multiple Vitamins-Minerals (PRESERVISION AREDS 2) CAPS Take 1 tablet by mouth daily.    [provider]  pravastatin  (PRAVACHOL ) 40 MG tablet Take 40 mg by mouth daily. 01/04/18   [provider]  sertraline  (ZOLOFT ) 50 MG tablet Take 50 mg by mouth at bedtime. 03/07/20   [provider]    Allergies: Patient has no known allergies.    Review of Systems  Reason unable to perform ROS: mental status.   All other systems reviewed and are negative.   Updated Vital Signs BP (!) 141/57 (BP Location: Right Arm)   Pulse 70   Temp 98.4 F (36.9 C) (Oral)   Resp 20   Ht 5' 4.8 (1.646 m)   Wt 60 kg   SpO2 95%   BMI 22.15 kg/m   Physical Exam Vitals and nursing note reviewed.  Constitutional:      General: She is in acute distress.     Appearance: She is well-developed. She is ill-appearing.  HENT:     Head: Normocephalic and atraumatic.  Eyes:     Conjunctiva/sclera: Conjunctivae normal.  Cardiovascular:     Rate and Rhythm: Normal rate and regular rhythm.     Heart sounds: No murmur heard. Pulmonary:     Effort: Pulmonary effort is normal. No respiratory distress.     Breath sounds: Normal breath sounds.  Abdominal:     Palpations: Abdomen is soft.     Tenderness: There is no abdominal tenderness.  Musculoskeletal:        General: Swelling present.     Cervical back: Neck supple.     Comments: Large chest wall and LUE hematoma with swelling  Skin:    General: Skin is warm and dry.     Capillary Refill: Capillary refill takes less than 2 seconds.  Neurological:     Mental Status: She is alert.  Comments: Moves all 4 extremities spontanteously, at baseline mental status      (all labs ordered are listed, but only abnormal results are displayed) Labs Reviewed  COMPREHENSIVE METABOLIC PANEL WITH GFR - Abnormal; Notable for the following components:      Result Value   Sodium 132 (*)    Glucose, Bld 208 (*)    BUN 25 (*)    Calcium 8.6 (*)    Total Protein 6.2 (*)    Albumin 2.9 (*)    AST 46 (*)    Total Bilirubin 1.9 (*)    GFR, Estimated 51 (*)    All other components within normal limits  CBC - Abnormal; Notable for the following components:   RBC 3.65 (*)    Hemoglobin 10.5 (*)    HCT 33.4 (*)    All other components within normal limits  PROTIME-INR - Abnormal; Notable for the following components:   Prothrombin Time 20.7 (*)    INR 1.7 (*)    All  other components within normal limits  CK TOTAL AND CKMB (NOT AT [facility]) - Abnormal; Notable for the following components:   Total CK 546 (*)    CK, MB 19.3 (*)    All other components within normal limits  I-STAT CHEM 8, ED - Abnormal; Notable for the following components:   Sodium 134 (*)    Chloride 94 (*)    BUN 27 (*)    Glucose, Bld 201 (*)    Calcium, Ion 1.10 (*)    Hemoglobin 11.6 (*)    HCT 34.0 (*)    All other components within normal limits  I-STAT CG4 LACTIC ACID, ED - Abnormal; Notable for the following components:   Lactic Acid, Venous 4.9 (*)    All other components within normal limits  ETHANOL  URINALYSIS, ROUTINE W REFLEX MICROSCOPIC  LACTIC ACID, PLASMA  LACTIC ACID, PLASMA  SAMPLE TO BLOOD BANK    EKG: EKG Interpretation Date/Time:  Sunday June 07 2024 12:43:11 EST Ventricular Rate:  68 PR Interval:  233 QRS Duration:  96 QT Interval:  422 QTC Calculation: 449 R Axis:   55  Text Interpretation: Sinus rhythm Atrial premature complexes Prolonged PR interval Compared with prior EKG from 05/12/2018 Confirmed by Gennaro Bouchard ([id number]) on 06/07/2024 12:44:11 PM  Radiology: CT L-SPINE NO CHARGE Result Date: 06/07/2024 EXAM: CT THORACIC AND LUMBAR SPINE WITH INTRAVENOUS CONTRAST 06/07/2024 01:17:08 PM TECHNIQUE: CT of the thoracic and lumbar spine was performed with the administration of 75 mL of iohexol  (OMNIPAQUE ) 350 MG/ML injection. Multiplanar reformatted images are provided for review. Automated exposure control, iterative reconstruction, and/or weight based adjustment of the mA/kV was utilized to reduce the radiation dose to as low as reasonably achievable. Incidental adrenal and/or renal findings do not require follow up imaging. COMPARISON: None available. CLINICAL HISTORY: FINDINGS: THORACIC SPINE: BONES AND ALIGNMENT: Vertebral body height loss. No acute fracture or suspicious bone lesion. Normal alignment. DEGENERATIVE CHANGES: Mild multilevel  degeneration. No spinal canal stenosis. SOFT TISSUES: No acute abnormality. LUMBAR SPINE: BONES AND ALIGNMENT: Vertebral body height loss. No acute fracture or suspicious bone lesion. Normal alignment. DEGENERATIVE CHANGES: Mild multilevel degeneration. No spinal canal stenosis. SOFT TISSUES: No acute abnormality. IMPRESSION: 1. No acute fracture or traumatic malalignment of the thoracic or lumbar spine. 2. No spinal canal stenosis. Electronically signed by: Franky Stanford MD 06/07/2024 01:46 PM EST RP Workstation: [id number]   CT T-SPINE NO CHARGE Result Date: 06/07/2024 EXAM: CT THORACIC AND LUMBAR SPINE WITH  INTRAVENOUS CONTRAST 06/07/2024 01:17:08 PM TECHNIQUE: CT of the thoracic and lumbar spine was performed with the administration of 75 mL of iohexol  (OMNIPAQUE ) 350 MG/ML injection. Multiplanar reformatted images are provided for review. Automated exposure control, iterative reconstruction, and/or weight based adjustment of the mA/kV was utilized to reduce the radiation dose to as low as reasonably achievable. Incidental adrenal and/or renal findings do not require follow up imaging. COMPARISON: None available. CLINICAL HISTORY: FINDINGS: THORACIC SPINE: BONES AND ALIGNMENT: Vertebral body height loss. No acute fracture or suspicious bone lesion. Normal alignment. DEGENERATIVE CHANGES: Mild multilevel degeneration. No spinal canal stenosis. SOFT TISSUES: No acute abnormality. LUMBAR SPINE: BONES AND ALIGNMENT: Vertebral body height loss. No acute fracture or suspicious bone lesion. Normal alignment. DEGENERATIVE CHANGES: Mild multilevel degeneration. No spinal canal stenosis. SOFT TISSUES: No acute abnormality. IMPRESSION: 1. No acute fracture or traumatic malalignment of the thoracic or lumbar spine. 2. No spinal canal stenosis. Electronically signed by: Franky Stanford MD 06/07/2024 01:46 PM EST RP Workstation: [id number]   CT CHEST ABDOMEN PELVIS W CONTRAST Result Date: 06/07/2024 CLINICAL DATA:  Fall.  Blunt poly trauma. On anticoagulation. Chest and abdominal pain. EXAM: CT CHEST, ABDOMEN, AND PELVIS WITH CONTRAST TECHNIQUE: Multidetector CT imaging of the chest, abdomen and pelvis was performed following the standard protocol during bolus administration of intravenous contrast. RADIATION DOSE REDUCTION: This exam was performed according to the departmental dose-optimization program which includes automated exposure control, adjustment of the mA and/or kV according to patient size and/or use of iterative reconstruction technique. CONTRAST:  75mL OMNIPAQUE  IOHEXOL  350 MG/ML SOLN COMPARISON:  Chest CT on 03/20/2018, and AP CT on 03/27/2018 FINDINGS: CT CHEST FINDINGS Cardiovascular: No evidence of thoracic aortic injury or mediastinal hematoma. No pericardial effusion. Mild cardiomegaly. Aortic and coronary atherosclerotic calcification noted. Mediastinum/Nodes: No evidence of hemorrhage or pneumomediastinum. No pathologically enlarged lymph nodes identified. 1.9 cm right thyroid  lobe nodule is seen, but unchanged compared to prior exam. Lungs/Pleura: No evidence of pulmonary contusion or other infiltrate. No evidence of pneumothorax or hemothorax. Musculoskeletal: Large hematoma is seen in the left breast and chest wall soft tissues measuring approximately 15.9 x 6.0 x 12.4 cm. Contusion is also seen throughout the left breast and lateral chest wall subcutaneous tissues. No acute fractures or suspicious bone lesions identified. CT ABDOMEN PELVIS FINDINGS Hepatobiliary: No hepatic laceration identified. A small cyst with peripheral calcification in the inferior right hepatic lobe measuring 1.6 cm is stable, consistent with benign etiology. No other liver lesions identified. Prior cholecystectomy. No evidence of biliary obstruction. Pancreas: No parenchymal laceration, mass, or inflammatory changes identified. Spleen: No evidence of splenic laceration. Adrenal/Urinary Tract: No hemorrhage or parenchymal lacerations  identified. No evidence of suspicious masses or hydronephrosis. Stomach/Bowel: Unopacified bowel loops are unremarkable in appearance. No evidence of hemoperitoneum. Diverticulosis is seen mainly involving the sigmoid colon, however there is no evidence of diverticulitis. Vascular/Lymphatic: No evidence of abdominal aortic injury or retroperitoneal hemorrhage. No pathologically enlarged lymph nodes identified. Reproductive: Prior hysterectomy noted. Adnexal regions are unremarkable in appearance. Other:  None. Musculoskeletal: No acute fractures or suspicious bone lesions identified. IMPRESSION: Large hematoma and subcutaneous soft tissue contusion throughout the left breast and chest wall soft tissues. No evidence of acute fracture, pneumothorax or hemothorax. No No evidence of traumatic injury within the abdomen or pelvis. Colonic diverticulosis, without radiographic evidence of diverticulitis. Stable 1.9 cm right thyroid  lobe nodule. Consider further evaluation with thyroid  ultrasound. (ref: J Am Coll Radiol. 2015 Feb;12(2): 143-50). Electronically Signed   By: Norleen LABOR  Graham M.D.   On: 06/07/2024 13:45   CT HEAD WO CONTRAST Result Date: 06/07/2024 CLINICAL DATA:  Head and neck trauma. Fall. Patient on blood thinners. EXAM: CT HEAD WITHOUT CONTRAST CT CERVICAL SPINE WITHOUT CONTRAST TECHNIQUE: Multidetector CT imaging of the head and cervical spine was performed following the standard protocol without intravenous contrast. Multiplanar CT image reconstructions of the cervical spine were also generated. RADIATION DOSE REDUCTION: This exam was performed according to the departmental dose-optimization program which includes automated exposure control, adjustment of the mA and/or kV according to patient size and/or use of iterative reconstruction technique. COMPARISON:  03/20/2018 FINDINGS: CT HEAD FINDINGS Brain: Ventricles, cisterns and other CSF spaces are within normal for patient's age as there is mild age  related atrophic change present. There is mild chronic ischemic microvascular disease. There is no mass, mass effect, shift of midline structures or acute hemorrhage. No evidence of acute infarction. Vascular: No hyperdense vessel or unexpected calcification. Skull: Normal. Negative for fracture or focal lesion. Sinuses/Orbits: Orbits are normal symmetric. Paranasal sinuses are well developed and demonstrate moderate opacification over the left maxillary sinus and sphenoid sinus with associated mucoperiosteal thickening compatible with chronic inflammatory change. Mastoid air cells are clear. Other: None. CT CERVICAL SPINE FINDINGS Alignment: Normal. Skull base and vertebrae: There is mild to moderate spondylosis throughout the cervical spine to include uncovertebral joint spurring and facet arthropathy. No acute fracture. Moderate right-sided neural foraminal narrowing at the C3-4 level and minimal left-sided neural foraminal narrowing at this level. Mild-to-moderate bilateral neural foraminal narrowing at the C4-5 level left worse than right. Mild bilateral neural from narrowing at the C5-6 level right worse than left and mild bilateral neural foraminal narrowing at the C6-7 level left worse than right. Soft tissues and spinal canal: No prevertebral fluid or swelling. No visible canal hematoma. Disc levels: Disc space narrowing from the C3-4 level to the C6-7 level. Upper chest: No acute findings. Other: None. IMPRESSION: 1. No acute brain injury. 2. Mild age related atrophic change and chronic ischemic microvascular disease. 3. No acute cervical spine injury. 4. Mild to moderate spondylosis throughout the cervical spine with multilevel disc disease and neural foraminal narrowing as described. Electronically Signed   By: Toribio Agreste M.D.   On: 06/07/2024 13:26   CT CERVICAL SPINE WO CONTRAST Result Date: 06/07/2024 CLINICAL DATA:  Head and neck trauma. Fall. Patient on blood thinners. EXAM: CT HEAD WITHOUT  CONTRAST CT CERVICAL SPINE WITHOUT CONTRAST TECHNIQUE: Multidetector CT imaging of the head and cervical spine was performed following the standard protocol without intravenous contrast. Multiplanar CT image reconstructions of the cervical spine were also generated. RADIATION DOSE REDUCTION: This exam was performed according to the departmental dose-optimization program which includes automated exposure control, adjustment of the mA and/or kV according to patient size and/or use of iterative reconstruction technique. COMPARISON:  03/20/2018 FINDINGS: CT HEAD FINDINGS Brain: Ventricles, cisterns and other CSF spaces are within normal for patient's age as there is mild age related atrophic change present. There is mild chronic ischemic microvascular disease. There is no mass, mass effect, shift of midline structures or acute hemorrhage. No evidence of acute infarction. Vascular: No hyperdense vessel or unexpected calcification. Skull: Normal. Negative for fracture or focal lesion. Sinuses/Orbits: Orbits are normal symmetric. Paranasal sinuses are well developed and demonstrate moderate opacification over the left maxillary sinus and sphenoid sinus with associated mucoperiosteal thickening compatible with chronic inflammatory change. Mastoid air cells are clear. Other: None. CT CERVICAL SPINE FINDINGS Alignment: Normal. Skull  base and vertebrae: There is mild to moderate spondylosis throughout the cervical spine to include uncovertebral joint spurring and facet arthropathy. No acute fracture. Moderate right-sided neural foraminal narrowing at the C3-4 level and minimal left-sided neural foraminal narrowing at this level. Mild-to-moderate bilateral neural foraminal narrowing at the C4-5 level left worse than right. Mild bilateral neural from narrowing at the C5-6 level right worse than left and mild bilateral neural foraminal narrowing at the C6-7 level left worse than right. Soft tissues and spinal canal: No  prevertebral fluid or swelling. No visible canal hematoma. Disc levels: Disc space narrowing from the C3-4 level to the C6-7 level. Upper chest: No acute findings. Other: None. IMPRESSION: 1. No acute brain injury. 2. Mild age related atrophic change and chronic ischemic microvascular disease. 3. No acute cervical spine injury. 4. Mild to moderate spondylosis throughout the cervical spine with multilevel disc disease and neural foraminal narrowing as described. Electronically Signed   By: Toribio Agreste M.D.   On: 06/07/2024 13:26   DG Humerus Left Result Date: 06/07/2024 CLINICAL DATA:  Fall yesterday with left arm pain. EXAM: LEFT HUMERUS - 2+ VIEW COMPARISON:  None Available. FINDINGS: Only single view submitted for review as two view was ordered. Provider was at bedside and stated only one view was needed. No evidence of acute fracture or dislocation. Bone alignment and mineralization is normal. IMPRESSION: No acute findings. Electronically Signed   By: Toribio Agreste M.D.   On: 06/07/2024 13:13   DG Chest Port 1 View Result Date: 06/07/2024 CLINICAL DATA:  Fall as patient on blood thinners. EXAM: PORTABLE CHEST 1 VIEW COMPARISON:  03/27/2018 FINDINGS: Lungs are adequately inflated and otherwise clear. Cardiomediastinal silhouette is normal. No acute fracture. IMPRESSION: No active disease. Electronically Signed   By: Toribio Agreste M.D.   On: 06/07/2024 13:10     Procedures   Medications Ordered in the ED  iohexol  (OMNIPAQUE ) 350 MG/ML injection 75 mL (75 mLs Intravenous Contrast Given 06/07/24 1316)  lactated ringers  bolus 1,000 mL (1,000 mLs Intravenous New Bag/Given 06/07/24 1359)                                    Medical Decision Making Cardiac monitor interpretation: Sinus rhythm, no ectopy  Social determinants of health: Lives by herself  Patient here for fall.  She is a level 2 trauma alert she is on blood thinners.  Daughter at bedside helps write history.  She states patient is  at her mental status baseline.  Last known normal last night at 6 PM and found today at 11 AM by her caregiver that comes during the days.  Patient has significant bruising and swelling to her right arm and upper chest but no imaging abnormalities.  Does have an elevated lactic acid and elevated CPK as well.  Was given IV fluids.  I spoke with Dr. Polly, trauma surgery and he recommended hospitalist admission and will plan to consult as needed.  Spoke with Dr. Georgina on the phone and he will admit the patient to his service for further workup and management.  Patient and family at bedside agreeable with plan for admission.  Problems Addressed: Contusion of left chest wall, initial encounter: acute illness or injury Elevated CPK: acute illness or injury that poses a threat to life or bodily functions Elevated lactic acid level: acute illness or injury that poses a threat to life or bodily functions  Fall, initial encounter: acute illness or injury  Amount and/or Complexity of Data Reviewed External Data Reviewed: notes.    Details: No prior ED records for review Labs: ordered. Decision-making details documented in ED Course.    Details: Ordered and reviewed by me patient has elevated lactate, leukocytosis as well as elevated CPK level, likely from traumatic rhabdomyolysis Radiology: ordered and independent interpretation performed. Decision-making details documented in ED Course.    Details: Ordered and reviewed by me Trauma scans show no acute abnormality ECG/medicine tests: ordered and independent interpretation performed. Decision-making details documented in ED Course.    Details: Ordered and interpreted by me in the absence of cardiology and shows sinus rhythm, no STEMI or significant change when compared to prior Discussion of management or test interpretation with external provider(s): Dr. Ronnell surgery-I spoke with him on the phone regarding the patient's case and he will consult as  needed recommended hospitalist admission  Dr. Bernadene spoke with him on the phone regarding patient's case reviewed with the patient for for further workup and management  Risk OTC drugs. Prescription drug management. Drug therapy requiring intensive monitoring for toxicity. Decision regarding hospitalization. Diagnosis or treatment significantly limited by social determinants of health.    Final diagnoses:  Contusion of left chest wall, initial encounter  Elevated CPK  Elevated lactic acid level  Fall, initial encounter    ED Discharge Orders     None          Gennaro Duwaine CROME, DO 06/07/24 1550
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
## 2024-06-08 DIAGNOSIS — S20212A Contusion of left front wall of thorax, initial encounter: Secondary | ICD-10-CM

## 2024-06-08 DIAGNOSIS — T796XXA Traumatic ischemia of muscle, initial encounter: Secondary | ICD-10-CM

## 2024-06-08 DIAGNOSIS — D62 Acute posthemorrhagic anemia: Secondary | ICD-10-CM

## 2024-06-08 DIAGNOSIS — I10 Essential (primary) hypertension: Secondary | ICD-10-CM | POA: Diagnosis not present

## 2024-06-08 DIAGNOSIS — R296 Repeated falls: Secondary | ICD-10-CM | POA: Diagnosis present

## 2024-06-08 DIAGNOSIS — T45515A Adverse effect of anticoagulants, initial encounter: Secondary | ICD-10-CM

## 2024-06-08 LAB — IRON AND TIBC
Iron: 25 ug/dL — ABNORMAL LOW (ref 28–170)
Saturation Ratios: 9 % — ABNORMAL LOW (ref 10.4–31.8)
TIBC: 293 ug/dL (ref 250–450)
UIBC: 268 ug/dL

## 2024-06-08 LAB — RETICULOCYTES
Immature Retic Fract: 14.4 % (ref 2.3–15.9)
RBC.: 2.84 MIL/uL — ABNORMAL LOW (ref 3.87–5.11)
Retic Count, Absolute: 50.8 K/uL (ref 19.0–186.0)
Retic Ct Pct: 1.8 % (ref 0.4–3.1)

## 2024-06-08 LAB — FOLATE: Folate: 15.2 ng/mL (ref >5.9)

## 2024-06-08 LAB — CBC
HCT: 24.7 % — ABNORMAL LOW (ref 36.0–46.0)
Hemoglobin: 7.9 g/dL — ABNORMAL LOW (ref 12.0–15.0)
MCH: 28.6 pg (ref 26.0–34.0)
MCHC: 32 g/dL (ref 30.0–36.0)
MCV: 89.5 fL (ref 80.0–100.0)
Platelets: 143 K/uL — ABNORMAL LOW (ref 150–400)
RBC: 2.76 MIL/uL — ABNORMAL LOW (ref 3.87–5.11)
RDW: 15.3 % (ref 11.5–15.5)
WBC: 6.9 K/uL (ref 4.0–10.5)
nRBC: 0 % (ref 0.0–0.2)

## 2024-06-08 LAB — COMPREHENSIVE METABOLIC PANEL WITH GFR
ALT: 17 U/L (ref 0–44)
AST: 37 U/L (ref 15–41)
Albumin: 2.2 g/dL — ABNORMAL LOW (ref 3.5–5.0)
Alkaline Phosphatase: 58 U/L (ref 38–126)
Anion gap: 7 (ref 5–15)
BUN: 29 mg/dL — ABNORMAL HIGH (ref 8–23)
CO2: 27 mmol/L (ref 22–32)
Calcium: 8 mg/dL — ABNORMAL LOW (ref 8.9–10.3)
Chloride: 100 mmol/L (ref 98–111)
Creatinine, Ser: 1.03 mg/dL — ABNORMAL HIGH (ref 0.44–1.00)
GFR, Estimated: 49 mL/min — ABNORMAL LOW (ref >60)
Glucose, Bld: 106 mg/dL — ABNORMAL HIGH (ref 70–99)
Potassium: 3.9 mmol/L (ref 3.5–5.1)
Sodium: 134 mmol/L — ABNORMAL LOW (ref 135–145)
Total Bilirubin: 1.3 mg/dL — ABNORMAL HIGH (ref 0.0–1.2)
Total Protein: 4.7 g/dL — ABNORMAL LOW (ref 6.5–8.1)

## 2024-06-08 LAB — HEMOGLOBIN AND HEMATOCRIT, BLOOD
HCT: 24 % — ABNORMAL LOW (ref 36.0–46.0)
Hemoglobin: 7.7 g/dL — ABNORMAL LOW (ref 12.0–15.0)

## 2024-06-08 LAB — VITAMIN B12: Vitamin B-12: 405 pg/mL (ref 180–914)

## 2024-06-08 LAB — CK: Total CK: 299 U/L — ABNORMAL HIGH (ref 38–234)

## 2024-06-08 LAB — FERRITIN: Ferritin: 34 ng/mL (ref 11–307)

## 2024-06-08 LAB — HEMOGLOBIN A1C
Hgb A1c MFr Bld: 6.2 % — ABNORMAL HIGH (ref 4.8–5.6)
Mean Plasma Glucose: 131 mg/dL

## 2024-06-08 MED ORDER — FERROUS SULFATE 325 (65 FE) MG PO TABS
325.0000 mg | ORAL_TABLET | Freq: Every day | ORAL | Status: DC
  Administered 2024-06-09 – 2024-06-12 (×4): 325 mg via ORAL
  Filled 2024-06-08 (×4): qty 1

## 2024-06-08 NOTE — Progress Notes (Signed)
 Progress Note   Patient: Lynn Alvarado FMW:[id number] DOB: 07/19/1924 DOA: 06/07/2024     0 DOS: the patient was seen and examined on 06/08/2024   Brief hospital course: Lynn Alvarado is a 89 y.o. female with medical history significant of anxiety disorder, atrial fibrillation with past episode of RVR diagnosed in 2019 on Eliquis , dyslipidemia, hypertension and chronic peripheral edema was found down in bathtub with significant bruising to the bilateral chest more so on the left. Patient has no recollection of falling or being in the tub. CT of the chest though did reveal a large hematoma in the left breast and chest wall soft tissues measuring 15.9 x 6 x 12.4 cm with an associated contusion that was demonstrated throughout the left breast and lateral chest wall subcutaneous tissues. Trauma service evaluated the patient and determined that current condition was self-limiting and no indication for trauma service related admission. Hospital service has been asked to evaluate the patient for admission.   Assessment and Plan: Unwitnessed fall at home Patient fell at home but does not remember falling History of prior mechanical falls with 4 documented (including this fall) in the past 6 months. Suspect possible degree of hypovolemia and orthostasis given patient elderly and on thiazide diuretic at home Continue telemetry to rule out arrhythmia/tachycardia as etiology PT and OT evaluation.   Large left breast/left chest wall hematoma and contusion Acute blood loss anemia Secondary to fall and Eliquis  contributing for blood loss. Hold Eliquis . Monitor Hb q12hr. Drop in Hb noted. Baseline Hb 14-15 dropped to 7.9. Anemia panel reviewed, started iron supplements. Appreciate assistance of trauma service.  Has very large hematoma with contusion as outlined in imaging with a likely self-limiting process   Mild rhabdomyolosis Last seen well at 6 PM on 11/29 and was found more than 12 hours later by  family CK only mildly elevated trending down. Continue gentle IV fluids.   Chronic atrial fibrillation currently maintaining sinus rhythm on Eliquis  Currently rate controlled and maintaining sinus rhythm with some PACs On bisoprolol  prior to admission and will continue Plan is to permanently discontinue Eliquis  after prolonged discussion of risk versus benefit with patient's daughter at the bedside   Hyperglycemia with mild hyponatremia No prior diagnosis of diabetes and could simply be related to appropriate cortisol response to stressor Added hemoglobin A1c to prior labs.   Hypertension At home on bisoprolol / hydrochlorothiazide  combo as well as Lasix Hold diuretics for now but continue bisoprolol    Dyslipidemia Continue Pravachol    Hypothyroidism Continue Synthroid    Anxiety Continue Zoloft     Out of bed to chair. Incentive spirometry. Nursing supportive care. Fall, aspiration precautions. Diet:  Diet Orders (From admission, onward)     Start     Ordered   06/07/24 1608  Diet regular Room service appropriate? Yes; Fluid consistency: Thin  Diet effective now       Question Answer Comment  Room service appropriate? Yes   Fluid consistency: Thin      06/07/24 1607           DVT prophylaxis: SCDs Start: 06/07/24 1607  Level of care: Telemetry   Code Status: Full Code  Subjective: Patient is seen and examined today morning. She is siting in chair and wishes to return to her bed. Neck collar noted. Has hard of hearing.  Physical Exam: Vitals:   06/08/24 0054 06/08/24 0223 06/08/24 0806 06/08/24 1141  BP: (!) 95/48 (!) 106/44 (!) 126/45 (!) 118/42  Pulse: 67 64 68  73  Resp: 17  20   Temp: (!) 97.5 F (36.4 C)  98.6 F (37 C) 98.2 F (36.8 C)  TempSrc: Oral  Oral   SpO2: 96%  94% 100%  Weight:      Height:        General - Elderly Caucasian female, distress due to pain. HEENT - PERRLA, EOMI, neck collar on chest wall bruise noted Lung - Clear,  basal rales, no rhonchi, wheezes. Heart - S1, S2 heard, no murmurs, rubs, trace pedal edema. Abdomen - Soft, non tender, bowel sounds good Neuro - Alert, awake and oriented x 3, non focal exam. Skin - Warm and dry.  Data Reviewed:      Latest Ref Rng & Units 06/08/2024    4:33 AM 06/07/2024   12:49 PM 06/07/2024   12:40 PM  CBC  WBC 4.0 - 10.5 K/uL 6.9   8.6   Hemoglobin 12.0 - 15.0 g/dL 7.9  88.3  89.4   Hematocrit 36.0 - 46.0 % 24.7  34.0  33.4   Platelets 150 - 400 K/uL 143   175       Latest Ref Rng & Units 06/08/2024    4:33 AM 06/07/2024   12:49 PM 06/07/2024   12:40 PM  BMP  Glucose 70 - 99 mg/dL 893  798  791   BUN 8 - 23 mg/dL 29  27  25    Creatinine 0.44 - 1.00 mg/dL 8.96  8.99  8.99   Sodium 135 - 145 mmol/L 134  134  132   Potassium 3.5 - 5.1 mmol/L 3.9  4.5  4.6   Chloride 98 - 111 mmol/L 100  94  98   CO2 22 - 32 mmol/L 27   22   Calcium 8.9 - 10.3 mg/dL 8.0   8.6    CT L-SPINE NO CHARGE Result Date: 06/07/2024 EXAM: CT THORACIC AND LUMBAR SPINE WITH INTRAVENOUS CONTRAST 06/07/2024 01:17:08 PM TECHNIQUE: CT of the thoracic and lumbar spine was performed with the administration of 75 mL of iohexol  (OMNIPAQUE ) 350 MG/ML injection. Multiplanar reformatted images are provided for review. Automated exposure control, iterative reconstruction, and/or weight based adjustment of the mA/kV was utilized to reduce the radiation dose to as low as reasonably achievable. Incidental adrenal and/or renal findings do not require follow up imaging. COMPARISON: None available. CLINICAL HISTORY: FINDINGS: THORACIC SPINE: BONES AND ALIGNMENT: Vertebral body height loss. No acute fracture or suspicious bone lesion. Normal alignment. DEGENERATIVE CHANGES: Mild multilevel degeneration. No spinal canal stenosis. SOFT TISSUES: No acute abnormality. LUMBAR SPINE: BONES AND ALIGNMENT: Vertebral body height loss. No acute fracture or suspicious bone lesion. Normal alignment. DEGENERATIVE CHANGES:  Mild multilevel degeneration. No spinal canal stenosis. SOFT TISSUES: No acute abnormality. IMPRESSION: 1. No acute fracture or traumatic malalignment of the thoracic or lumbar spine. 2. No spinal canal stenosis. Electronically signed by: Franky Stanford MD 06/07/2024 01:46 PM EST RP Workstation: [id number]   CT T-SPINE NO CHARGE Result Date: 06/07/2024 EXAM: CT THORACIC AND LUMBAR SPINE WITH INTRAVENOUS CONTRAST 06/07/2024 01:17:08 PM TECHNIQUE: CT of the thoracic and lumbar spine was performed with the administration of 75 mL of iohexol  (OMNIPAQUE ) 350 MG/ML injection. Multiplanar reformatted images are provided for review. Automated exposure control, iterative reconstruction, and/or weight based adjustment of the mA/kV was utilized to reduce the radiation dose to as low as reasonably achievable. Incidental adrenal and/or renal findings do not require follow up imaging. COMPARISON: None available. CLINICAL HISTORY: FINDINGS: THORACIC SPINE: BONES AND ALIGNMENT: Vertebral  body height loss. No acute fracture or suspicious bone lesion. Normal alignment. DEGENERATIVE CHANGES: Mild multilevel degeneration. No spinal canal stenosis. SOFT TISSUES: No acute abnormality. LUMBAR SPINE: BONES AND ALIGNMENT: Vertebral body height loss. No acute fracture or suspicious bone lesion. Normal alignment. DEGENERATIVE CHANGES: Mild multilevel degeneration. No spinal canal stenosis. SOFT TISSUES: No acute abnormality. IMPRESSION: 1. No acute fracture or traumatic malalignment of the thoracic or lumbar spine. 2. No spinal canal stenosis. Electronically signed by: Franky Stanford MD 06/07/2024 01:46 PM EST RP Workstation: [id number]   CT CHEST ABDOMEN PELVIS W CONTRAST Result Date: 06/07/2024 CLINICAL DATA:  Fall. Blunt poly trauma. On anticoagulation. Chest and abdominal pain. EXAM: CT CHEST, ABDOMEN, AND PELVIS WITH CONTRAST TECHNIQUE: Multidetector CT imaging of the chest, abdomen and pelvis was performed following the standard  protocol during bolus administration of intravenous contrast. RADIATION DOSE REDUCTION: This exam was performed according to the departmental dose-optimization program which includes automated exposure control, adjustment of the mA and/or kV according to patient size and/or use of iterative reconstruction technique. CONTRAST:  75mL OMNIPAQUE  IOHEXOL  350 MG/ML SOLN COMPARISON:  Chest CT on 03/20/2018, and AP CT on 03/27/2018 FINDINGS: CT CHEST FINDINGS Cardiovascular: No evidence of thoracic aortic injury or mediastinal hematoma. No pericardial effusion. Mild cardiomegaly. Aortic and coronary atherosclerotic calcification noted. Mediastinum/Nodes: No evidence of hemorrhage or pneumomediastinum. No pathologically enlarged lymph nodes identified. 1.9 cm right thyroid  lobe nodule is seen, but unchanged compared to prior exam. Lungs/Pleura: No evidence of pulmonary contusion or other infiltrate. No evidence of pneumothorax or hemothorax. Musculoskeletal: Large hematoma is seen in the left breast and chest wall soft tissues measuring approximately 15.9 x 6.0 x 12.4 cm. Contusion is also seen throughout the left breast and lateral chest wall subcutaneous tissues. No acute fractures or suspicious bone lesions identified. CT ABDOMEN PELVIS FINDINGS Hepatobiliary: No hepatic laceration identified. A small cyst with peripheral calcification in the inferior right hepatic lobe measuring 1.6 cm is stable, consistent with benign etiology. No other liver lesions identified. Prior cholecystectomy. No evidence of biliary obstruction. Pancreas: No parenchymal laceration, mass, or inflammatory changes identified. Spleen: No evidence of splenic laceration. Adrenal/Urinary Tract: No hemorrhage or parenchymal lacerations identified. No evidence of suspicious masses or hydronephrosis. Stomach/Bowel: Unopacified bowel loops are unremarkable in appearance. No evidence of hemoperitoneum. Diverticulosis is seen mainly involving the sigmoid  colon, however there is no evidence of diverticulitis. Vascular/Lymphatic: No evidence of abdominal aortic injury or retroperitoneal hemorrhage. No pathologically enlarged lymph nodes identified. Reproductive: Prior hysterectomy noted. Adnexal regions are unremarkable in appearance. Other:  None. Musculoskeletal: No acute fractures or suspicious bone lesions identified. IMPRESSION: Large hematoma and subcutaneous soft tissue contusion throughout the left breast and chest wall soft tissues. No evidence of acute fracture, pneumothorax or hemothorax. No No evidence of traumatic injury within the abdomen or pelvis. Colonic diverticulosis, without radiographic evidence of diverticulitis. Stable 1.9 cm right thyroid  lobe nodule. Consider further evaluation with thyroid  ultrasound. (ref: J Am Coll Radiol. 2015 Feb;12(2): 143-50). Electronically Signed   By: Norleen DELENA Kil M.D.   On: 06/07/2024 13:45   CT HEAD WO CONTRAST Result Date: 06/07/2024 CLINICAL DATA:  Head and neck trauma. Fall. Patient on blood thinners. EXAM: CT HEAD WITHOUT CONTRAST CT CERVICAL SPINE WITHOUT CONTRAST TECHNIQUE: Multidetector CT imaging of the head and cervical spine was performed following the standard protocol without intravenous contrast. Multiplanar CT image reconstructions of the cervical spine were also generated. RADIATION DOSE REDUCTION: This exam was performed according to the departmental dose-optimization program which  includes automated exposure control, adjustment of the mA and/or kV according to patient size and/or use of iterative reconstruction technique. COMPARISON:  03/20/2018 FINDINGS: CT HEAD FINDINGS Brain: Ventricles, cisterns and other CSF spaces are within normal for patient's age as there is mild age related atrophic change present. There is mild chronic ischemic microvascular disease. There is no mass, mass effect, shift of midline structures or acute hemorrhage. No evidence of acute infarction. Vascular: No  hyperdense vessel or unexpected calcification. Skull: Normal. Negative for fracture or focal lesion. Sinuses/Orbits: Orbits are normal symmetric. Paranasal sinuses are well developed and demonstrate moderate opacification over the left maxillary sinus and sphenoid sinus with associated mucoperiosteal thickening compatible with chronic inflammatory change. Mastoid air cells are clear. Other: None. CT CERVICAL SPINE FINDINGS Alignment: Normal. Skull base and vertebrae: There is mild to moderate spondylosis throughout the cervical spine to include uncovertebral joint spurring and facet arthropathy. No acute fracture. Moderate right-sided neural foraminal narrowing at the C3-4 level and minimal left-sided neural foraminal narrowing at this level. Mild-to-moderate bilateral neural foraminal narrowing at the C4-5 level left worse than right. Mild bilateral neural from narrowing at the C5-6 level right worse than left and mild bilateral neural foraminal narrowing at the C6-7 level left worse than right. Soft tissues and spinal canal: No prevertebral fluid or swelling. No visible canal hematoma. Disc levels: Disc space narrowing from the C3-4 level to the C6-7 level. Upper chest: No acute findings. Other: None. IMPRESSION: 1. No acute brain injury. 2. Mild age related atrophic change and chronic ischemic microvascular disease. 3. No acute cervical spine injury. 4. Mild to moderate spondylosis throughout the cervical spine with multilevel disc disease and neural foraminal narrowing as described. Electronically Signed   By: Toribio Agreste M.D.   On: 06/07/2024 13:26   CT CERVICAL SPINE WO CONTRAST Result Date: 06/07/2024 CLINICAL DATA:  Head and neck trauma. Fall. Patient on blood thinners. EXAM: CT HEAD WITHOUT CONTRAST CT CERVICAL SPINE WITHOUT CONTRAST TECHNIQUE: Multidetector CT imaging of the head and cervical spine was performed following the standard protocol without intravenous contrast. Multiplanar CT image  reconstructions of the cervical spine were also generated. RADIATION DOSE REDUCTION: This exam was performed according to the departmental dose-optimization program which includes automated exposure control, adjustment of the mA and/or kV according to patient size and/or use of iterative reconstruction technique. COMPARISON:  03/20/2018 FINDINGS: CT HEAD FINDINGS Brain: Ventricles, cisterns and other CSF spaces are within normal for patient's age as there is mild age related atrophic change present. There is mild chronic ischemic microvascular disease. There is no mass, mass effect, shift of midline structures or acute hemorrhage. No evidence of acute infarction. Vascular: No hyperdense vessel or unexpected calcification. Skull: Normal. Negative for fracture or focal lesion. Sinuses/Orbits: Orbits are normal symmetric. Paranasal sinuses are well developed and demonstrate moderate opacification over the left maxillary sinus and sphenoid sinus with associated mucoperiosteal thickening compatible with chronic inflammatory change. Mastoid air cells are clear. Other: None. CT CERVICAL SPINE FINDINGS Alignment: Normal. Skull base and vertebrae: There is mild to moderate spondylosis throughout the cervical spine to include uncovertebral joint spurring and facet arthropathy. No acute fracture. Moderate right-sided neural foraminal narrowing at the C3-4 level and minimal left-sided neural foraminal narrowing at this level. Mild-to-moderate bilateral neural foraminal narrowing at the C4-5 level left worse than right. Mild bilateral neural from narrowing at the C5-6 level right worse than left and mild bilateral neural foraminal narrowing at the C6-7 level left worse than right.  Soft tissues and spinal canal: No prevertebral fluid or swelling. No visible canal hematoma. Disc levels: Disc space narrowing from the C3-4 level to the C6-7 level. Upper chest: No acute findings. Other: None. IMPRESSION: 1. No acute brain injury. 2.  Mild age related atrophic change and chronic ischemic microvascular disease. 3. No acute cervical spine injury. 4. Mild to moderate spondylosis throughout the cervical spine with multilevel disc disease and neural foraminal narrowing as described. Electronically Signed   By: Toribio Agreste M.D.   On: 06/07/2024 13:26   DG Humerus Left Result Date: 06/07/2024 CLINICAL DATA:  Fall yesterday with left arm pain. EXAM: LEFT HUMERUS - 2+ VIEW COMPARISON:  None Available. FINDINGS: Only single view submitted for review as two view was ordered. Provider was at bedside and stated only one view was needed. No evidence of acute fracture or dislocation. Bone alignment and mineralization is normal. IMPRESSION: No acute findings. Electronically Signed   By: Toribio Agreste M.D.   On: 06/07/2024 13:13   DG Chest Port 1 View Result Date: 06/07/2024 CLINICAL DATA:  Fall as patient on blood thinners. EXAM: PORTABLE CHEST 1 VIEW COMPARISON:  03/27/2018 FINDINGS: Lungs are adequately inflated and otherwise clear. Cardiomediastinal silhouette is normal. No acute fracture. IMPRESSION: No active disease. Electronically Signed   By: Toribio Agreste M.D.   On: 06/07/2024 13:10    Family Communication: no family at bedside.  Disposition: Status is: Inpatient Remains inpatient appropriate because: Hb monitoring, telemetry, PT/ OT eval  Planned Discharge Destination: Skilled nursing facility     Time spent: 46 minutes  Author: Concepcion Riser, MD 06/08/2024 3:55 PM Secure chat 7am to 7pm For on call review [email or web address].

## 2024-06-08 NOTE — Evaluation (Signed)
 Occupational Therapy Evaluation Patient Details Name: Lynn Alvarado MRN: [id number] DOB: 10/08/1924 Today's Date: 06/08/2024   History of Present Illness   89 yo s/p fall in bathtub with bruising L chest  with mild rhabdomyolosis CT (+) hematoma on L breast and soft tissue no bony injury PMH anxiety disroder, afib with past RVR on eliquis , dyslipidemia, HTN, chronic peripheral edema     Clinical Impressions PTA, pt lives home alone w/ intermittent caregiver support during the day. Pt typically ambulatory without AD and able to manage ADLs with some assist for IADLs. Pt presents now with deficits in strength, endurance, balance and L sided discomfort. Pt w/ cervical collar on d/t awaiting C spine clearance from MD. Pt required Mod-Max A for bed mobility but able to stand/transfer with no more than Min A. Pt requires overall Min A for ADLs assessed. Daughter at bedside, supportive and open to consider continued inpatient follow up therapy, <3 hours/day at DC.     If plan is discharge home, recommend the following:   A little help with walking and/or transfers;A little help with bathing/dressing/bathroom;Assistance with cooking/housework;Help with stairs or ramp for entrance     Functional Status Assessment   Patient has had a recent decline in their functional status and demonstrates the ability to make significant improvements in function in a reasonable and predictable amount of time.     Equipment Recommendations   None recommended by OT     Recommendations for Other Services         Precautions/Restrictions   Precautions Precautions: Fall Restrictions Weight Bearing Restrictions Per Provider Order: No     Mobility Bed Mobility Overal bed mobility: Needs Assistance Bed Mobility: Rolling, Sidelying to Sit Rolling: Mod assist Sidelying to sit: Max assist       General bed mobility comments: Cues for log rolling d/t C spine still not officially cleared and  collar still in place. Heavy assist to lift trunk and assist to bring LE off of bed    Transfers Overall transfer level: Needs assistance Equipment used: None Transfers: Sit to/from Stand, Bed to chair/wheelchair/BSC Sit to Stand: Contact guard assist     Step pivot transfers: Min assist     General transfer comment: to recliner reaching to armrests. No RW available in room at time of eval      Balance Overall balance assessment: Needs assistance Sitting-balance support: No upper extremity supported, Feet supported Sitting balance-Leahy Scale: Fair     Standing balance support: No upper extremity supported, Single extremity supported, During functional activity Standing balance-Leahy Scale: Fair                             ADL either performed or assessed with clinical judgement   ADL Overall ADL's : Needs assistance/impaired Eating/Feeding: Independent   Grooming: Set up;Sitting   Upper Body Bathing: Minimal assistance;Sitting   Lower Body Bathing: Minimal assistance;Sitting/lateral leans;Sit to/from stand   Upper Body Dressing : Minimal assistance;Sitting   Lower Body Dressing: Minimal assistance;Sitting/lateral leans;Sit to/from stand Lower Body Dressing Details (indicate cue type and reason): assist to don L LE into underwear, light assist for balance in standing Toilet Transfer: Minimal assistance;Stand-pivot;BSC/3in1   Toileting- Clothing Manipulation and Hygiene: Minimal assistance;Sitting/lateral lean;Sit to/from stand               Vision Baseline Vision/History: 1 Wears glasses Ability to See in Adequate Light: 0 Adequate Patient Visual Report: No change from baseline  Vision Assessment?: No apparent visual deficits     Perception         Praxis         Pertinent Vitals/Pain Pain Assessment Pain Assessment: Faces Faces Pain Scale: Hurts little more Pain Location: L side Pain Descriptors / Indicators: Grimacing, Guarding Pain  Intervention(s): Monitored during session, Limited activity within patient's tolerance     Extremity/Trunk Assessment Upper Extremity Assessment Upper Extremity Assessment: Right hand dominant;Generalized weakness   Lower Extremity Assessment Lower Extremity Assessment: Defer to PT evaluation   Cervical / Trunk Assessment Cervical / Trunk Assessment: Normal   Communication Communication Communication: Impaired Factors Affecting Communication: Hearing impaired   Cognition Arousal: Alert Behavior During Therapy: WFL for tasks assessed/performed Cognition: Difficult to assess Difficult to assess due to: Hard of hearing/deaf           OT - Cognition Comments: suspect at baseline, per chart, mild memory deficits                 Following commands: Impaired Following commands impaired: Follows one step commands with increased time     Cueing  General Comments   Cueing Techniques: Verbal cues;Gestural cues;Tactile cues;Visual cues  Daughter at bedside, supportive. Cervical collar in place on entry; awaiting C spine clearance from MD. Discussed with nursing who has already sent secure chat to MD   Exercises     Shoulder Instructions      Home Living Family/patient expects to be discharged to:: Private residence Living Arrangements: Alone Available Help at Discharge: Family;Personal care attendant;Available PRN/intermittently Type of Home: House Home Access: Stairs to enter [employer] of Steps: 5 Entrance Stairs-Rails: Right;Left Home Layout: One level     Bathroom Shower/Tub: Producer, Television/film/video: Standard     Home Equipment: Toilet riser;Cane - quad;Cane - single point;Rolling Walker (2 wheels);Shower seat   Additional Comments: Has an aide during the day for a few hours + Saturday. Daughter checks in on Sundays      Prior Functioning/Environment Prior Level of Function : Independent/Modified Independent;History of Falls (last  six months)             Mobility Comments: typically no AD for mobility. Per daughter, pt has been resistant to use DME for mobility ADLs Comments: Typically able to manage showers, stands for showers and dress self with caregiver assist as needed + IADL assist    OT Problem List: Decreased strength;Decreased activity tolerance;Impaired balance (sitting and/or standing);Pain   OT Treatment/Interventions: Self-care/ADL training;Therapeutic exercise;Energy conservation;DME and/or AE instruction;Therapeutic activities;Patient/family education;Balance training      OT Goals(Current goals can be found in the care plan section)   Acute Rehab OT Goals Patient Stated Goal: pt hopeful to go home soon, daughter open to rehab OT Goal Formulation: With patient/family Time For Goal Achievement: 06/22/24 Potential to Achieve Goals: Good ADL Goals Pt Will Perform Grooming: with set-up;standing Pt Will Perform Lower Body Bathing: with set-up;sitting/lateral leans;sit to/from stand Pt Will Transfer to Toilet: with supervision;ambulating Pt Will Perform Toileting - Clothing Manipulation and hygiene: with set-up;sit to/from stand;sitting/lateral leans   OT Frequency:  Min 2X/week    Co-evaluation              AM-PAC OT 6 Clicks Daily Activity     Outcome Measure Help from another person eating meals?: None Help from another person taking care of personal grooming?: A Little Help from another person toileting, which includes using toliet, bedpan, or urinal?: A Little Help from another person  bathing (including washing, rinsing, drying)?: A Little Help from another person to put on and taking off regular upper body clothing?: A Little Help from another person to put on and taking off regular lower body clothing?: A Little 6 Click Score: 19   End of Session Equipment Utilized During Treatment: Gait belt Nurse Communication: Mobility status  Activity Tolerance: Patient tolerated  treatment well Patient left: in chair;with call bell/phone within reach;with chair alarm set;with family/visitor present (OT located chair alarm box for room; unable to locate nurse call cord - RN aware)  OT Visit Diagnosis: Unsteadiness on feet (R26.81);Other abnormalities of gait and mobility (R26.89);Muscle weakness (generalized) (M62.81)                Time: 9156-9085 OT Time Calculation (min): 31 min Charges:  OT General Charges $OT Visit: 1 Visit OT Evaluation $OT Eval Low Complexity: 1 Low OT Treatments $Self Care/Home Management : 8-22 mins  Mliss NOVAK, OTR/L Acute Rehab Services Office: [phone]   Mliss Fish 06/08/2024, 9:47 AM

## 2024-06-08 NOTE — Evaluation (Signed)
 Physical Therapy Evaluation Patient Details Name: Lynn Alvarado MRN: [id number] DOB: 05/20/1925 Today's Date: 06/08/2024  History of Present Illness  89 yo s/p fall in bathtub with bruising L chest  with mild rhabdomyolosis CT (+) hematoma on L breast and soft tissue no bony injury PMH anxiety disroder, afib with past RVR on eliquis , dyslipidemia, HTN, chronic peripheral edema  Clinical Impression  Patient presents with decreased mobility due to pain, decreased balance, decreased activity tolerance, decreased safety awareness and history of falls.  Patient previously living alone with intermittent assistance from aide.  She was not using an assistive device at baseline though evidently history of other falls.  Patient needing mod A overall today for mobility with RW to bathroom and back to bed.  She was fearful and seemingly confused with discomfort from c-collar still in place and needing min to mod A for safety and fall prevention.  Feel she will benefit from skilled PT in the acute setting and from post-acute inpatient rehab (< 3 hours/day) prior to d/c home.         If plan is discharge home, recommend the following: A little help with walking and/or transfers;A lot of help with bathing/dressing/bathroom;Assist for transportation;Assistance with cooking/housework;Help with stairs or ramp for entrance   Can travel by private vehicle   No    Equipment Recommendations None recommended by PT  Recommendations for Other Services       Functional Status Assessment Patient has had a recent decline in their functional status and demonstrates the ability to make significant improvements in function in a reasonable and predictable amount of time.     Precautions / Restrictions        Mobility  Bed Mobility Overal bed mobility: Needs Assistance Bed Mobility: Sit to Supine       Sit to supine: Mod assist, HOB elevated   General bed mobility comments: assist for legs into bed and cues for  placing head on pillows as laying the opposite way initially, assist to reposition hips and trunk with cues and mod A    Transfers Overall transfer level: Needs assistance Equipment used: Rolling walker (2 wheels) Transfers: Sit to/from Stand Sit to Stand: Min assist           General transfer comment: from recliner and from toilet pt attempting with less assistance though unable min A for anterior weight shift and cues to push with legs    Ambulation/Gait Ambulation/Gait assistance: Min assist Gait Distance (Feet): 12 Feet (x 2) Assistive device: Rolling walker (2 wheels), 1 person hand held assist Gait Pattern/deviations: Step-to pattern, Step-through pattern, Decreased stride length, Shuffle, Trunk flexed       General Gait Details: to bathroom from recliner with RW and min A, then when coming out of bathroom pt anxious and pushing walker out so provided HHA and pt using sink on other side  Stairs            Wheelchair Mobility     Tilt Bed    Modified Rankin (Stroke Patients Only)       Balance Overall balance assessment: Needs assistance   Sitting balance-Leahy Scale: Good Sitting balance - Comments: completed toilet hygiene seated unaided   Standing balance support: Single extremity supported, No upper extremity supported, During functional activity Standing balance-Leahy Scale: Poor Standing balance comment: LOB standing trying to pull up briefs min A for safety  Pertinent Vitals/Pain Pain Assessment Pain Assessment: Faces Faces Pain Scale: Hurts little more Pain Location: generalized Pain Descriptors / Indicators: Grimacing, Guarding, Moaning Pain Intervention(s): Monitored during session, Limited activity within patient's tolerance    Home Living Family/patient expects to be discharged to:: Private residence Living Arrangements: Alone Available Help at Discharge: Family;Personal care attendant;Available  PRN/intermittently Type of Home: House Home Access: Stairs to enter Entrance Stairs-Rails: Doctor, [employer] of Steps: 5   Home Layout: One level Home Equipment: Toilet riser;Cane - quad;Cane - single point;Rolling Walker (2 wheels);Shower seat Additional Comments: Has an aide during the day for a few hours + Saturday. Daughter checks in on Sundays    Prior Function Prior Level of Function : Independent/Modified Independent;History of Falls (last six months)             Mobility Comments: typically no AD for mobility. Per daughter, pt has been resistant to use DME for mobility ADLs Comments: Typically able to manage showers, stands for showers and dress self with caregiver assist as needed + IADL assist     Extremity/Trunk Assessment   Upper Extremity Assessment Upper Extremity Assessment: Defer to OT evaluation    Lower Extremity Assessment Lower Extremity Assessment: Generalized weakness    Cervical / Trunk Assessment Cervical / Trunk Assessment: Kyphotic;Other exceptions Cervical / Trunk Exceptions: wearing c-collar until clearance  Communication   Communication Communication: Impaired Factors Affecting Communication: Hearing impaired    Cognition Arousal: Alert Behavior During Therapy: WFL for tasks assessed/performed   PT - Cognitive impairments: Awareness, Memory, Problem solving, Safety/Judgement                       PT - Cognition Comments: pushing walker away coming out of bathroom, flexed posture and not looking up when cued trying to push c-collar off and states was doing better at home not recalling fall in bathroom Following commands: Impaired Following commands impaired: Follows one step commands inconsistently, Follows one step commands with increased time     Cueing Cueing Techniques: Verbal cues, Gestural cues, Tactile cues, Visual cues     General Comments General comments (skin integrity, edema, etc.): c-collar still  in place and pt uncomfortable, helped initiallyh when pt eating to place cloth under chin; poor fit and adjusted for comfort as best as possible throughout; HR 88 after in room ambulation to bathroom    Exercises     Assessment/Plan    PT Assessment Patient needs continued PT services  PT Problem List Decreased strength;Decreased cognition;Decreased activity tolerance;Decreased balance;Decreased mobility;Decreased safety awareness;Pain       PT Treatment Interventions DME instruction;Gait training;Functional mobility training;Therapeutic activities;Therapeutic exercise;Balance training;Patient/family education    PT Goals (Current goals can be found in the Care Plan section)  Acute Rehab PT Goals Patient Stated Goal: to go home PT Goal Formulation: Patient unable to participate in goal setting Time For Goal Achievement: 06/22/24 Potential to Achieve Goals: Fair    Frequency Min 2X/week     Co-evaluation               AM-PAC PT 6 Clicks Mobility  Outcome Measure Help needed turning from your back to your side while in a flat bed without using bedrails?: A Lot Help needed moving from lying on your back to sitting on the side of a flat bed without using bedrails?: A Lot Help needed moving to and from a bed to a chair (including a wheelchair)?: A Lot Help needed standing up from a chair  using your arms (e.g., wheelchair or bedside chair)?: A Little Help needed to walk in hospital room?: A Little Help needed climbing 3-5 steps with a railing? : Total 6 Click Score: 13    End of Session Equipment Utilized During Treatment: Gait belt;Other (comment) (c-collar) Activity Tolerance: Patient limited by fatigue Patient left: in bed;with call bell/phone within reach;with bed alarm set;with nursing/sitter in room   PT Visit Diagnosis: Other abnormalities of gait and mobility (R26.89);History of falling (Z91.81)    Time: 1130-1143 PT Time Calculation (min) (ACUTE ONLY): 13  min   Charges:   PT Evaluation $PT Eval Moderate Complexity: 1 Mod   PT General Charges $$ ACUTE PT VISIT: 1 Visit         Micheline Portal, PT Acute Rehabilitation Services [phone] 06/08/2024   Montie Portal 06/08/2024, 12:05 PM

## 2024-06-08 NOTE — Progress Notes (Signed)
   06/08/24 1051  Spiritual Encounters  Type of Visit Initial  Care provided to: Pt and family  Reason for visit Advance directives  OnCall Visit No  Spiritual Framework  Presenting Themes Impactful experiences and emotions  Community/Connection Family  Patient Stress Factors Health changes  Family Stress Factors Health changes  Interventions  Spiritual Care Interventions Made Compassionate presence;Established relationship of care and support;Normalization of emotions  Intervention Outcomes  Outcomes Awareness of support;Awareness of health   Chaplain met with Pt and daughter at bedside to work on the Proofreader (AD). The document need to be be notarized to be finish. The original document  was left with the Pt in the room. Chaplain provided emotional and spiritual support thought the process and remains available as needed.

## 2024-06-09 DIAGNOSIS — R296 Repeated falls: Secondary | ICD-10-CM | POA: Diagnosis not present

## 2024-06-09 DIAGNOSIS — T796XXA Traumatic ischemia of muscle, initial encounter: Secondary | ICD-10-CM | POA: Diagnosis not present

## 2024-06-09 DIAGNOSIS — S20212A Contusion of left front wall of thorax, initial encounter: Secondary | ICD-10-CM | POA: Diagnosis not present

## 2024-06-09 DIAGNOSIS — D62 Acute posthemorrhagic anemia: Secondary | ICD-10-CM | POA: Diagnosis not present

## 2024-06-09 LAB — TYPE AND SCREEN
ABO/RH(D): A POS
Antibody Screen: NEGATIVE

## 2024-06-09 LAB — HEMOGLOBIN AND HEMATOCRIT, BLOOD
HCT: 23.5 % — ABNORMAL LOW (ref 36.0–46.0)
HCT: 23.5 % — ABNORMAL LOW (ref 36.0–46.0)
Hemoglobin: 7.7 g/dL — ABNORMAL LOW (ref 12.0–15.0)
Hemoglobin: 7.4 g/dL — ABNORMAL LOW (ref 12.0–15.0)

## 2024-06-09 MED ORDER — GUAIFENESIN-DM 100-10 MG/5ML PO SYRP
5.0000 mL | ORAL_SOLUTION | ORAL | Status: DC | PRN
  Administered 2024-06-09 – 2024-06-12 (×6): 5 mL via ORAL
  Filled 2024-06-09 (×6): qty 10

## 2024-06-09 NOTE — Plan of Care (Signed)

## 2024-06-09 NOTE — Progress Notes (Signed)
 Progress Note   Patient: Lynn Alvarado FMW:[id number] DOB: 01/21/1925 DOA: 06/07/2024     1 DOS: the patient was seen and examined on 06/09/2024   Brief hospital course: Lynn Alvarado is a 89 y.o. female with medical history significant of anxiety disorder, atrial fibrillation with past episode of RVR diagnosed in 2019 on Eliquis , dyslipidemia, hypertension and chronic peripheral edema was found down in bathtub with significant bruising to the bilateral chest more so on the left. Patient has no recollection of falling or being in the tub. CT of the chest though did reveal a large hematoma in the left breast and chest wall soft tissues measuring 15.9 x 6 x 12.4 cm with an associated contusion that was demonstrated throughout the left breast and lateral chest wall subcutaneous tissues. Trauma service evaluated the patient and determined that current condition was self-limiting and no indication for trauma service related admission. Hospital service has been asked to evaluate the patient for admission.   Assessment and Plan: Unwitnessed fall at home Patient fell at home but does not remember falling History of prior mechanical falls with 4 documented (including this fall) in the past 6 months. Suspect possible degree of hypovolemia, dehydration. Continue telemetry to rule out arrhythmia/tachycardia as etiology PT and OT advised SNF.   Large left breast/ left chest wall hematoma and contusion Acute blood loss anemia Secondary to fall and Eliquis  contributing for blood loss. Hold Eliquis . Monitor Hb q12hr. Drop in Hb noted. Baseline Hb 14-15 dropped to 7.7 stable for now.  Type and screen negative. Anemia panel reviewed, continue iron supplements. Appreciate assistance of trauma service.  Has very large hematoma with contusion as outlined in imaging with a likely self-limiting process   Mild rhabdomyolosis Last seen well at 6 PM on 11/29 and was found more than 12 hours later by family CK only  mildly elevated trending down. Continue gentle IV fluids.   Chronic atrial fibrillation currently maintaining sinus rhythm on Eliquis  Currently rate controlled and maintaining sinus rhythm with some PACs On bisoprolol  prior to admission and will continue Plan is to permanently discontinue Eliquis  after prolonged discussion of risk versus benefit with patient's daughter at the bedside   Hyperglycemia with mild hyponatremia No prior diagnosis of diabetes and could simply be related to appropriate cortisol response to stressor Added hemoglobin A1c to prior labs.   Hypertension At home on bisoprolol / hydrochlorothiazide  combo as well as Lasix Hold diuretics for now but continue bisoprolol    Dyslipidemia Continue Pravachol    Hypothyroidism Continue Synthroid    Anxiety Continue Zoloft     Out of bed to chair. Incentive spirometry. Nursing supportive care. Fall, aspiration precautions. Diet:  Diet Orders (From admission, onward)     Start     Ordered   06/07/24 1608  Diet regular Room service appropriate? Yes; Fluid consistency: Thin  Diet effective now       Question Answer Comment  Room service appropriate? Yes   Fluid consistency: Thin      06/07/24 1607           DVT prophylaxis: SCDs Start: 06/07/24 1607  Level of care: Telemetry   Code Status: Full Code  Subjective: Patient is seen and examined today morning. She is lying her bed.  Has hard of hearing.  Denies any complaints, no overnight issues.  Physical Exam: Vitals:   06/09/24 0422 06/09/24 0854 06/09/24 0935 06/09/24 1148  BP: (!) 121/51 106/62 (!) 129/47 (!) 118/53  Pulse: 71 64 63 68  Resp:  18  20 20   Temp: 98.6 F (37 C) 98.6 F (37 C) 98.1 F (36.7 C) 98.3 F (36.8 C)  TempSrc: Oral Oral Oral Oral  SpO2: 93% 98% 98% 95%  Weight:      Height:        General - Elderly Caucasian female, mild distress due to pain. HEENT - PERRLA, EOMI, neck collar on chest wall bruise noted Lung - Clear,  basal rales, no rhonchi, wheezes. Heart - S1, S2 heard, no murmurs, rubs, trace pedal edema. Abdomen - Soft, non tender, bowel sounds good Neuro - Alert, awake and oriented, non focal exam. Skin - Warm and dry.  Data Reviewed:      Latest Ref Rng & Units 06/09/2024    5:54 AM 06/08/2024    5:50 PM 06/08/2024    4:33 AM  CBC  WBC 4.0 - 10.5 K/uL   6.9   Hemoglobin 12.0 - 15.0 g/dL 7.7  7.7  7.9   Hematocrit 36.0 - 46.0 % 23.5  24.0  24.7   Platelets 150 - 400 K/uL   143       Latest Ref Rng & Units 06/08/2024    4:33 AM 06/07/2024   12:49 PM 06/07/2024   12:40 PM  BMP  Glucose 70 - 99 mg/dL 893  798  791   BUN 8 - 23 mg/dL 29  27  25    Creatinine 0.44 - 1.00 mg/dL 8.96  8.99  8.99   Sodium 135 - 145 mmol/L 134  134  132   Potassium 3.5 - 5.1 mmol/L 3.9  4.5  4.6   Chloride 98 - 111 mmol/L 100  94  98   CO2 22 - 32 mmol/L 27   22   Calcium 8.9 - 10.3 mg/dL 8.0   8.6    No results found.   Family Communication: no family at bedside.  Disposition: Status is: Inpatient Remains inpatient appropriate because: Hb monitoring, telemetry, PT/ OT eval  Planned Discharge Destination: Skilled nursing facility     Time spent: 44 minutes  Author: Concepcion Riser, MD 06/09/2024 1:31 PM Secure chat 7am to 7pm For on call review [email or web address].

## 2024-06-10 DIAGNOSIS — R296 Repeated falls: Secondary | ICD-10-CM | POA: Diagnosis not present

## 2024-06-10 DIAGNOSIS — T796XXA Traumatic ischemia of muscle, initial encounter: Secondary | ICD-10-CM | POA: Diagnosis not present

## 2024-06-10 DIAGNOSIS — S20212A Contusion of left front wall of thorax, initial encounter: Secondary | ICD-10-CM | POA: Diagnosis not present

## 2024-06-10 DIAGNOSIS — D62 Acute posthemorrhagic anemia: Secondary | ICD-10-CM | POA: Diagnosis not present

## 2024-06-10 LAB — HEMOGLOBIN A1C
Hgb A1c MFr Bld: 6 % — ABNORMAL HIGH (ref 4.8–5.6)
Mean Plasma Glucose: 126 mg/dL

## 2024-06-10 LAB — HEMOGLOBIN AND HEMATOCRIT, BLOOD
HCT: 23.4 % — ABNORMAL LOW (ref 36.0–46.0)
HCT: 25.4 % — ABNORMAL LOW (ref 36.0–46.0)
Hemoglobin: 7.6 g/dL — ABNORMAL LOW (ref 12.0–15.0)
Hemoglobin: 8 g/dL — ABNORMAL LOW (ref 12.0–15.0)

## 2024-06-10 MED ORDER — FUROSEMIDE 20 MG PO TABS
20.0000 mg | ORAL_TABLET | Freq: Every day | ORAL | Status: AC
  Administered 2024-06-10 – 2024-06-11 (×2): 20 mg via ORAL
  Filled 2024-06-10 (×2): qty 1

## 2024-06-10 NOTE — Progress Notes (Signed)
 Progress Note   Patient: Lynn Alvarado FMW:[id number] DOB: Dec 09, 1924 DOA: 06/07/2024     2 DOS: the patient was seen and examined on 06/10/2024   Brief hospital course: Lynn Alvarado is a 89 y.o. female with medical history significant of anxiety disorder, atrial fibrillation with past episode of RVR diagnosed in 2019 on Eliquis , dyslipidemia, hypertension and chronic peripheral edema was found down in bathtub with significant bruising to the bilateral chest more so on the left. Patient has no recollection of falling or being in the tub. CT of the chest though did reveal a large hematoma in the left breast and chest wall soft tissues measuring 15.9 x 6 x 12.4 cm with an associated contusion that was demonstrated throughout the left breast and lateral chest wall subcutaneous tissues. Trauma service evaluated the patient and determined that current condition was self-limiting and no indication for trauma service related admission. Hospital service has been asked to evaluate the patient for admission.   Assessment and Plan: Unwitnessed fall at home Patient fell at home but does not remember falling History of prior mechanical falls with 4 documented (including this fall) in the past 6 months. Suspect possible degree of hypovolemia, dehydration. Continue telemetry to rule out arrhythmia/tachycardia as etiology PT and OT advised SNF. TOC working on firefighter, SNF bed search.   Large left breast/ left chest wall hematoma and contusion Acute blood loss anemia Secondary to fall and Eliquis  contributing for blood loss. Appreciate assistance of trauma service.  Has very large hematoma with contusion as outlined in imaging with a likely self-limiting process Hold Eliquis . Monitor Hb q12hr. Drop in Hb but stable. Baseline Hb 14-15 dropped to 7.6 stable for now.  Type and screen active. Continue iron supplements.   Mild rhabdomyolosis CK only mildly elevated trending down. Stop gentle IV  fluids.   Chronic atrial fibrillation currently maintaining sinus rhythm on Eliquis  Currently rate controlled and maintaining sinus rhythm with some PACs On bisoprolol  prior to admission and will continue Discontinue Eliquis  given risks outweigh benefits after discussion with daughter.   Hyperglycemia with mild hyponatremia No prior diagnosis of diabetes and could simply be related to appropriate cortisol response to stressor A1c 6.0. monitor for hypoglycemia.   Hypertension Hold hydrochlorothiazide , continue bisoprolol , lasix PRN.   Dyslipidemia Continue Pravachol    Hypothyroidism Continue Synthroid    Anxiety Continue Zoloft     Out of bed to chair. Incentive spirometry. Nursing supportive care. Fall, aspiration precautions. Diet:  Diet Orders (From admission, onward)     Start     Ordered   06/07/24 1608  Diet regular Room service appropriate? Yes; Fluid consistency: Thin  Diet effective now       Question Answer Comment  Room service appropriate? Yes   Fluid consistency: Thin      06/07/24 1607           DVT prophylaxis: SCDs Start: 06/07/24 1607  Level of care: Telemetry   Code Status: Full Code  Subjective: Patient is seen and examined today morning. She is lying comfortably.  Denies any complaints, no overnight issues. Discussed with daughte rover phone.  Physical Exam: Vitals:   06/10/24 0008 06/10/24 0357 06/10/24 0803 06/10/24 1154  BP: (!) 123/46 (!) 122/57 (!) 150/52 (!) 102/52  Pulse: 69 70 67 63  Resp:   20 19  Temp: 97.8 F (36.6 C) 98.4 F (36.9 C) 97.8 F (36.6 C) 98.1 F (36.7 C)  TempSrc:   Oral Oral  SpO2: 93% 92% 97%  98%  Weight:      Height:        General - Elderly Caucasian female, no apparent distress. Hard of hearing. HEENT - PERRLA, EOMI, neck collar on chest wall bruise noted Lung - Clear, basal rales, no rhonchi, wheezes. Heart - S1, S2 heard, no murmurs, rubs, trace pedal edema. Abdomen - Soft, non tender, bowel  sounds good Neuro - Alert, awake and oriented, non focal exam. Skin - Warm and dry.  Data Reviewed:      Latest Ref Rng & Units 06/10/2024    4:22 AM 06/09/2024    6:26 PM 06/09/2024    5:54 AM  CBC  Hemoglobin 12.0 - 15.0 g/dL 7.6  7.4  7.7   Hematocrit 36.0 - 46.0 % 23.4  23.5  23.5       Latest Ref Rng & Units 06/08/2024    4:33 AM 06/07/2024   12:49 PM 06/07/2024   12:40 PM  BMP  Glucose 70 - 99 mg/dL 893  798  791   BUN 8 - 23 mg/dL 29  27  25    Creatinine 0.44 - 1.00 mg/dL 8.96  8.99  8.99   Sodium 135 - 145 mmol/L 134  134  132   Potassium 3.5 - 5.1 mmol/L 3.9  4.5  4.6   Chloride 98 - 111 mmol/L 100  94  98   CO2 22 - 32 mmol/L 27   22   Calcium 8.9 - 10.3 mg/dL 8.0   8.6    No results found.   Family Communication: daughter over phone updated.  Disposition: Status is: Inpatient Remains inpatient appropriate because: Hb monitoring, insurance auth for SNF  Planned Discharge Destination: Skilled nursing facility     Time spent: 42 minutes  Author: Concepcion Riser, MD 06/10/2024 2:36 PM Secure chat 7am to 7pm For on call review [email or web address].

## 2024-06-10 NOTE — [place] FL2 (Signed)
 [place]  MEDICAID [id number] LEVEL OF CARE FORM     IDENTIFICATION  Patient Name: Lynn Alvarado Birthdate: 1924/11/16 Sex: female Admission Date (Current Location): 06/07/2024  [facility] and [place] Number:  Producer, Television/film/video and Address:  [facility] [place], [address] [place], [place] [address]      Provider Number: [id number]  Attending Physician Name and Address:  Darci Pore, MD  Relative Name and Phone Number:  Eben Ask (Daughter)  [phone]    Current Level of Care: Hospital Recommended Level of Care: Skilled Nursing Facility Prior Approval Number:    Date Approved/Denied:   PASRR Number:    Discharge Plan: SNF    Current Diagnoses: Patient Active Problem List   Diagnosis Date Noted   Unwitnessed fall 06/07/2024   Atrial fibrillation with rapid ventricular response (HCC) 03/20/2018   Mediastinal hematoma 03/20/2018   Closed fracture of sternum 03/20/2018   Essential hypertension 03/20/2018   Hypothyroidism 03/20/2018    Orientation RESPIRATION BLADDER Height & Weight     Self, Time, Place  Normal Continent Weight: 132 lb 4.4 oz (60 kg) Height:  5' 4.8 (164.6 cm)  BEHAVIORAL SYMPTOMS/MOOD NEUROLOGICAL BOWEL NUTRITION STATUS      Continent Diet  AMBULATORY STATUS COMMUNICATION OF NEEDS Skin   Limited Assist Verbally Normal                       Personal Care Assistance Level of Assistance  Bathing, Feeding, Dressing Bathing Assistance: Maximum assistance Feeding assistance: Limited assistance Dressing Assistance: Maximum assistance     Functional Limitations Info  Sight, Hearing Sight Info: Impaired Hearing Info: Impaired (deaf in right ear and hard of hearing with hearing aid in left ear.)      SPECIAL CARE FACTORS FREQUENCY  PT (By licensed PT), OT (By licensed OT)     PT Frequency: 5x/week OT Frequency: 5x/week            Contractures Contractures Info: Not present    Additional Factors  Info  Code Status, Allergies Code Status Info: Full Allergies Info: NKA           Current Medications (06/10/2024):  This is the current hospital active medication list Current Facility-Administered Medications  Medication Dose Route Frequency Provider Last Rate Last Admin   acetaminophen  (TYLENOL ) tablet 650 mg  650 mg Oral Q6H PRN Alto Isaiah CROME, NP   650 mg at 06/10/24 0358   Or   acetaminophen  (TYLENOL ) suppository 650 mg  650 mg Rectal Q6H PRN Alto Isaiah CROME, NP       bisoprolol  (ZEBETA ) tablet 5 mg  5 mg Oral Daily Alto Isaiah CROME, NP   5 mg at 06/10/24 9180   ferrous sulfate tablet 325 mg  325 mg Oral Q breakfast Darci Pore, MD   325 mg at 06/10/24 0819   guaiFENesin-dextromethorphan (ROBITUSSIN DM) 100-10 MG/5ML syrup 5 mL  5 mL Oral Q4H PRN Darci Pore, MD   5 mL at 06/10/24 0819   levothyroxine  (SYNTHROID ) tablet 25 mcg  25 mcg Oral Daily Alto Isaiah CROME, NP   25 mcg at 06/10/24 0422   LORazepam  (ATIVAN ) tablet 0.5-1 mg  0.5-1 mg Oral BID PRN Alto Isaiah CROME, NP       multivitamin with minerals tablet 1 tablet  1 tablet Oral Daily Alto Isaiah CROME, NP   1 tablet at 06/10/24 0819   ondansetron  (ZOFRAN ) tablet 4 mg  4 mg Oral Q6H PRN Alto Isaiah CROME,  NP       Or   ondansetron  (ZOFRAN ) injection 4 mg  4 mg Intravenous Q6H PRN Alto Isaiah CROME, NP       pravastatin  (PRAVACHOL ) tablet 40 mg  40 mg Oral Daily Alto Isaiah CROME, NP   40 mg at 06/10/24 9180   sertraline  (ZOLOFT ) tablet 50 mg  50 mg Oral QHS Alto Isaiah CROME, NP   50 mg at 06/09/24 2001   sodium chloride  flush (NS) 0.9 % injection 3 mL  3 mL Intravenous Q12H Alto Isaiah CROME, NP   3 mL at 06/10/24 9178     Discharge Medications: Please see discharge summary for a list of discharge medications.  Relevant Imaging Results:  Relevant Lab Results:   Additional Information SSN: [id number]  Sharyne Drum, Student-Social Work

## 2024-06-10 NOTE — Plan of Care (Signed)

## 2024-06-10 NOTE — Progress Notes (Signed)
 Physical Therapy Treatment Patient Details Name: Lynn Alvarado MRN: [id number] DOB: Feb 25, 1925 Today's Date: 06/10/2024   History of Present Illness 89 yo s/p fall in bathtub with bruising L chest  with mild rhabdomyolosis CT (+) hematoma on L breast and soft tissue no bony injury PMH anxiety disroder, afib with past RVR on eliquis , dyslipidemia, HTN, chronic peripheral edema    PT Comments  Pt received in supine and requests to use the bathroom. Pt very HoH and requires increased cues for command following. Pt able to complete pericare with min A and and cues to complete hand hygiene. Pt requires cues and intermittent assist for RW management and balance. Pt reports little fatigue, but requests to rest in the recliner. Pt declines further mobility despite encouragement. Pt continues to benefit from PT services to progress toward functional mobility goals.     If plan is discharge home, recommend the following: A little help with walking and/or transfers;A lot of help with bathing/dressing/bathroom;Assist for transportation;Assistance with cooking/housework;Help with stairs or ramp for entrance   Can travel by private vehicle     No  Equipment Recommendations  None recommended by PT    Recommendations for Other Services       Precautions / Restrictions Precautions Precautions: Fall Restrictions Weight Bearing Restrictions Per Provider Order: No     Mobility  Bed Mobility Overal bed mobility: Needs Assistance Bed Mobility: Supine to Sit     Supine to sit: Min assist, HOB elevated, Used rails     General bed mobility comments: cues for technique and use of bedrail with light min A for trunk elevation and scooting forward to EOB    Transfers Overall transfer level: Needs assistance Equipment used: Rolling walker (2 wheels) Transfers: Sit to/from Stand Sit to Stand: Min assist           General transfer comment: STS from EOB and toilet with min A for rise and cues for  hand placement    Ambulation/Gait Ambulation/Gait assistance: Min assist Gait Distance (Feet): 10 Feet (+15) Assistive device: Rolling walker (2 wheels) Gait Pattern/deviations: Step-through pattern, Decreased stride length, Trunk flexed       General Gait Details: Pt demonstrates slow gait with tendency to push RW forward and slightly to the side requiring cues and intermittent min A for RW management   Stairs             Wheelchair Mobility     Tilt Bed    Modified Rankin (Stroke Patients Only)       Balance Overall balance assessment: Needs assistance Sitting-balance support: No upper extremity supported, Feet supported Sitting balance-Leahy Scale: Good     Standing balance support: During functional activity, Bilateral upper extremity supported, Reliant on assistive device for balance Standing balance-Leahy Scale: Poor Standing balance comment: with RW support                            Communication Communication Communication: Impaired Factors Affecting Communication: Hearing impaired (very HOH)  Cognition Arousal: Alert Behavior During Therapy: WFL for tasks assessed/performed   PT - Cognitive impairments: Awareness, Memory, Problem solving, Safety/Judgement                         Following commands: Impaired Following commands impaired: Follows one step commands with increased time    Cueing Cueing Techniques: Verbal cues, Gestural cues, Tactile cues, Visual cues  Exercises  General Comments        Pertinent Vitals/Pain Pain Assessment Pain Assessment: Faces Faces Pain Scale: No hurt     PT Goals (current goals can now be found in the care plan section) Acute Rehab PT Goals Patient Stated Goal: to go home PT Goal Formulation: Patient unable to participate in goal setting Time For Goal Achievement: 06/22/24 Progress towards PT goals: Progressing toward goals    Frequency    Min 2X/week       AM-PAC  PT 6 Clicks Mobility   Outcome Measure  Help needed turning from your back to your side while in a flat bed without using bedrails?: A Little Help needed moving from lying on your back to sitting on the side of a flat bed without using bedrails?: A Little Help needed moving to and from a bed to a chair (including a wheelchair)?: A Little Help needed standing up from a chair using your arms (e.g., wheelchair or bedside chair)?: A Little Help needed to walk in hospital room?: A Little Help needed climbing 3-5 steps with a railing? : Total 6 Click Score: 16    End of Session Equipment Utilized During Treatment: Gait belt Activity Tolerance: Patient limited by fatigue Patient left: with call bell/phone within reach;in chair;with chair alarm set Nurse Communication: Mobility status;Other (comment) (no nurse call cord for chair alarm) PT Visit Diagnosis: Other abnormalities of gait and mobility (R26.89);History of falling (Z91.81)     Time: 8640-8581 PT Time Calculation (min) (ACUTE ONLY): 19 min  Charges:    $Gait Training: 8-22 mins PT General Charges $$ ACUTE PT VISIT: 1 Visit                    Darryle George, PTA Acute Rehabilitation Services Secure Chat Preferred  [phone]    Darryle George 06/10/2024, 2:38 PM

## 2024-06-10 NOTE — TOC Initial Note (Signed)
 Transition of Care [facility]) - Initial/Assessment Note    Patient Details  Name: Lynn Alvarado MRN: [id number] Date of Birth: November 03, 1924  [facility] CM/SW Contact:    Sherline Clack, LCSWA Phone Number: 06/10/2024, 10:59 AM  Clinical Narrative:                  CSW spoke with patient's daughter at bedside regarding SNF placement. Patient's daughter is agreeable to patient being placed at a facility for rehab. Patient's daughter requested CSW look at facilities in [place], but understands patient might have to stay in [place]. MSW intern Maddie filled out FL2 and faxed information to facilities. CSW will present facility list to patient's daughter when facilities have responded.  Patient's daughter stopped by the [facility] office on the unit to ask about nurse aide resources. CSW provided patient's daughter with a list of companies that work in the area. CSW will continue to follow.   Expected Discharge Plan: Skilled Nursing Facility Barriers to Discharge: English As A Second Language Teacher, SNF Pending bed offer   Patient Goals and CMS Choice            Expected Discharge Plan and Services       Living arrangements for the past 2 months: Single Family Home                                      Prior Living Arrangements/Services Living arrangements for the past 2 months: Single Family Home Lives with:: Adult Children Patient language and need for interpreter reviewed:: Yes            Current home services: Homehealth aide    Activities of Daily Living   ADL Screening (condition at time of admission) Independently performs ADLs?: Yes (appropriate for developmental age) Is the patient deaf or have difficulty hearing?: Yes Does the patient have difficulty seeing, even when wearing glasses/contacts?: No Does the patient have difficulty concentrating, remembering, or making decisions?: Yes  Permission Sought/Granted                   Emotional Assessment Appearance:: Appears stated age Attitude/Demeanor/Rapport: Unable to Assess Affect (typically observed): Unable to Assess Orientation: : Oriented to Self, Oriented to Place, Oriented to  Time      Admission diagnosis:  Elevated CPK [R74.8] Elevated lactic acid level [R79.89] Fall, initial encounter [W19.XXXA] Contusion of left chest wall, initial encounter [S20.212A] Unwitnessed fall [R29.6] Patient Active Problem List   Diagnosis Date Noted   Unwitnessed fall 06/07/2024   Atrial fibrillation with rapid ventricular response (HCC) 03/20/2018   Mediastinal hematoma 03/20/2018   Closed fracture of sternum 03/20/2018   Essential hypertension 03/20/2018   Hypothyroidism 03/20/2018   PCP:  Loreli Kins, MD Pharmacy:   [facility] [phone] - [place], [place] - [address] [place] [address] Phone: [phone] Fax: [phone]  [id number] - [place], [place] - [address] [place] [address] Phone: [phone] Fax: [phone]  [facility] - [place], [place] - [facility] AT Portal to Registered [facility] Sites [address] [place] [address] Phone: [phone] Fax: [phone]     Social Drivers of Health (SDOH) Social History: SDOH Screenings   Food Insecurity: No Food Insecurity (06/07/2024)  Housing: Low Risk  (06/07/2024)  Transportation Needs: No Transportation Needs (06/07/2024)  Utilities: Not At Risk (06/07/2024)  Social Connections: Socially Isolated (06/07/2024)  Tobacco Use: Low Risk  (06/07/2024)   SDOH Interventions:     Readmission Risk Interventions     No data to display

## 2024-06-10 NOTE — Progress Notes (Signed)
 Occupational Therapy Treatment Patient Details Name: Lynn Alvarado MRN: [id number] DOB: 11-17-1924 Today's Date: 06/10/2024   History of present illness 89 yo s/p fall in bathtub with bruising L chest  with mild rhabdomyolosis CT (+) hematoma on L breast and soft tissue no bony injury PMH anxiety disroder, afib with past RVR on eliquis , dyslipidemia, HTN, chronic peripheral edema   OT comments  Pt progressing toward goals this session, in chair upon arrival, pt ambulating to sink for standing grooming task, needs incr cues due to [facility] despite hearing aid. Pt presenting with impairments listed below, will follow acutely. Patient will benefit from continued inpatient follow up therapy, <3 hours/day to maximize safety/ind with ADL/functional mobility.       If plan is discharge home, recommend the following:  A little help with walking and/or transfers;A little help with bathing/dressing/bathroom;Assistance with cooking/housework;Help with stairs or ramp for entrance   Equipment Recommendations  None recommended by OT    Recommendations for Other Services      Precautions / Restrictions Precautions Precautions: Fall Restrictions Weight Bearing Restrictions Per Provider Order: No       Mobility Bed Mobility                    Transfers Overall transfer level: Needs assistance Equipment used: Rolling walker (2 wheels) Transfers: Sit to/from Stand Sit to Stand: Min assist                 Balance Overall balance assessment: Needs assistance Sitting-balance support: No upper extremity supported, Feet supported Sitting balance-Leahy Scale: Good     Standing balance support: During functional activity, Bilateral upper extremity supported, Reliant on assistive device for balance Standing balance-Leahy Scale: Poor Standing balance comment: with RW support                           ADL either performed or assessed with clinical judgement   ADL Overall  ADL's : Needs assistance/impaired     Grooming: Wash/dry face;Sitting;Set up                   Toilet Transfer: Contact guard assist;Ambulation;Rolling walker (2 wheels)           Functional mobility during ADLs: Contact guard assist;Rolling walker (2 wheels)      Extremity/Trunk Assessment Upper Extremity Assessment Upper Extremity Assessment: Generalized weakness   Lower Extremity Assessment Lower Extremity Assessment: Defer to PT evaluation        Vision   Additional Comments: not formally assessed   Perception Perception Perception: Not tested   Praxis Praxis Praxis: Not tested   Communication Communication Communication: Impaired Factors Affecting Communication: Hearing impaired (HOH with L hearing aid in)   Cognition Arousal: Alert Behavior During Therapy: [facility] for tasks assessed/performed Cognition: Difficult to assess Difficult to assess due to: Hard of hearing/deaf                             Following commands: Impaired Following commands impaired: Follows one step commands with increased time      Cueing   Cueing Techniques: Verbal cues, Gestural cues, Tactile cues, Visual cues  Exercises      Shoulder Instructions       General Comments VSS+    Pertinent Vitals/ Pain       Pain Assessment Pain Assessment: No/denies pain  Home Living  Prior Functioning/Environment              Frequency  Min 2X/week        Progress Toward Goals  OT Goals(current goals can now be found in the care plan section)  Progress towards OT goals: Progressing toward goals  Acute Rehab OT Goals Patient Stated Goal: none stated OT Goal Formulation: With patient/family Time For Goal Achievement: 06/22/24 Potential to Achieve Goals: Good ADL Goals Pt Will Perform Grooming: with set-up;standing Pt Will Perform Lower Body Bathing: with set-up;sitting/lateral leans;sit  to/from stand Pt Will Transfer to Toilet: with supervision;ambulating Pt Will Perform Toileting - Clothing Manipulation and hygiene: with set-up;sit to/from stand;sitting/lateral leans  Plan      Co-evaluation                 AM-PAC OT 6 Clicks Daily Activity     Outcome Measure   Help from another person eating meals?: None Help from another person taking care of personal grooming?: A Little Help from another person toileting, which includes using toliet, bedpan, or urinal?: A Little Help from another person bathing (including washing, rinsing, drying)?: A Little Help from another person to put on and taking off regular upper body clothing?: A Little Help from another person to put on and taking off regular lower body clothing?: A Little 6 Click Score: 19    End of Session Equipment Utilized During Treatment: Gait belt;Rolling walker (2 wheels)  OT Visit Diagnosis: Unsteadiness on feet (R26.81);Other abnormalities of gait and mobility (R26.89);Muscle weakness (generalized) (M62.81)   Activity Tolerance Patient tolerated treatment well   Patient Left in chair;with call bell/phone within reach;with chair alarm set   Nurse Communication Mobility status        Time: 1521-1540 OT Time Calculation (min): 19 min  Charges: OT General Charges $OT Visit: 1 Visit OT Treatments $Self Care/Home Management : 8-22 mins  Cedrik Heindl K, OTD, OTR/L SecureChat Preferred Acute Rehab [phone]   Laneta POUR Koonce 06/10/2024, 3:56 PM

## 2024-06-10 NOTE — TOC Progression Note (Addendum)
 Transition of Care [facility]) - Progression Note    Patient Details  Name: Lynn Alvarado MRN: [id number] Date of Birth: [birth year]  Transition of Care [facility]) CM/SW Contact  Sharyne Drum, Student-Social Work Phone Number: 06/10/2024, 2:39 PM  Clinical Narrative:     Update 4:29 PM: Darrian with [facility] confirmed a bed will be open on Friday. CSW will continue to follow and update DC plan.  MSW Student contacted daughter via cell phone to give SNF options. Daughter chose [facility] for SNF. MSW Student reached out to [facility] to let them know patient choice and accepted facility in the [place]. CSW will continue to follow. Anticipated d/c is Friday, 06/12/2024  Expected Discharge Plan: Skilled Nursing Facility Barriers to Discharge: Insurance Authorization, SNF Pending bed offer               Expected Discharge Plan and Services       Living arrangements for the past 2 months: Single Family Home                                       Social Drivers of Health (SDOH) Interventions SDOH Screenings   Food Insecurity: No Food Insecurity (06/07/2024)  Housing: Low Risk  (06/07/2024)  Transportation Needs: No Transportation Needs (06/07/2024)  Utilities: Not At Risk (06/07/2024)  Social Connections: Socially Isolated (06/07/2024)  Tobacco Use: Low Risk  (06/07/2024)    Readmission Risk Interventions     No data to display

## 2024-06-11 LAB — HEMOGLOBIN AND HEMATOCRIT, BLOOD
HCT: 25.3 % — ABNORMAL LOW (ref 36.0–46.0)
Hemoglobin: 8 g/dL — ABNORMAL LOW (ref 12.0–15.0)

## 2024-06-11 NOTE — Plan of Care (Signed)

## 2024-06-11 NOTE — Care Management Important Message (Signed)
 Important Message  Patient Details  Name: Lynn Alvarado MRN: [id number] Date of Birth: [birth year]   Important Message Given:  Yes - Medicare IM     Claretta Deed 06/11/2024, 3:33 PM

## 2024-06-11 NOTE — Progress Notes (Signed)
 Progress Note   Patient: Lynn Alvarado FMW:[id number] DOB: [birth year] DOA: 06/07/2024     3 DOS: the patient was seen and examined on 06/11/2024   Brief hospital course: Lynn Alvarado is a 89 y.o. female with medical history significant of anxiety disorder, atrial fibrillation with past episode of RVR diagnosed in 2019 on Eliquis , dyslipidemia, hypertension and chronic peripheral edema was found down in bathtub with significant bruising to the bilateral chest more so on the left. Patient has no recollection of falling or being in the tub. CT of the chest though did reveal a large hematoma in the left breast and chest wall soft tissues measuring 15.9 x 6 x 12.4 cm with an associated contusion that was demonstrated throughout the left breast and lateral chest wall subcutaneous tissues. Trauma service evaluated the patient and determined that current condition was self-limiting and no indication for trauma service related admission. Hospital service has been asked to evaluate the patient for admission.   She had chest wall trauma, low Hb but stable. Eliquis  stopped per daughter request. PT/ OT suggested SNF, awaiting placement.  Assessment and Plan: Unwitnessed fall at home Patient fell at home but does not remember falling History of prior mechanical falls with 4 documented (including this fall) in the past 6 months. Suspect possible degree of hypovolemia, dehydration. Continue telemetry to rule out arrhythmia/tachycardia as etiology PT and OT advised SNF. Per TOC can go tomorrow to [facility].   Large left breast/ left chest wall hematoma and contusion Acute blood loss anemia Secondary to fall and Eliquis  contributing for blood loss. Appreciate assistance of trauma service.  Has very large hematoma with contusion as outlined in imaging with a likely self-limiting process. Baseline Hb 14-15 dropped to 7.6 stable for now.   Hold Eliquis . Drop in Hb but stable. No need for further Hb  check. Continue iron supplements.   Mild rhabdomyolosis CK only mildly elevated trending down. Stop gentle IV fluids.   Chronic atrial fibrillation currently maintaining sinus rhythm on Eliquis  Currently rate controlled and maintaining sinus rhythm with some PACs On bisoprolol  prior to admission and will continue Discontinued Eliquis  given risks outweigh benefits after discussion with daughter.   Hyperglycemia with mild hyponatremia No prior diagnosis of diabetes and could simply be related to appropriate cortisol response to stressor A1c 6.0. monitor for hypoglycemia.   Hypertension Hold hydrochlorothiazide , continue bisoprolol , lasix  PRN.   Dyslipidemia Continue Pravachol    Hypothyroidism Continue Synthroid    Anxiety Continue Zoloft      Out of bed to chair. Incentive spirometry. Nursing supportive care. Fall, aspiration precautions. Diet:  Diet Orders (From admission, onward)     Start     Ordered   06/07/24 1608  Diet regular Room service appropriate? Yes; Fluid consistency: Thin  Diet effective now       Question Answer Comment  Room service appropriate? Yes   Fluid consistency: Thin      06/07/24 1607           DVT prophylaxis: SCDs Start: 06/07/24 1607  Level of care: Telemetry   Code Status: Full Code  Subjective: Patient is seen and examined today morning. She is sitting in chair, has no complaints of pain. Daughter at bedside happy to see her improve.   Physical Exam: Vitals:   06/11/24 0017 06/11/24 0428 06/11/24 0730 06/11/24 1200  BP: (!) 149/74 (!) 159/73 116/73 (!) 113/50  Pulse: 68 (!) 50 72 70  Resp:   18 18  Temp: (!) 97.5 F (  36.4 C)  97.9 F (36.6 C) 97.9 F (36.6 C)  TempSrc:      SpO2: 96% 97% 96% 94%  Weight:      Height:        General - Elderly Caucasian female, no apparent distress.  HEENT - PERRLA, EOMI, neck collar on chest wall bruise noted Lung - Clear, basal rales, no rhonchi, wheezes. Heart - S1, S2 heard, no  murmurs, rubs, trace pedal edema. Abdomen - Soft, non tender, bowel sounds good Neuro - Alert, awake and oriented, non focal exam. Skin - Warm and dry.  Data Reviewed:      Latest Ref Rng & Units 06/11/2024    4:58 AM 06/10/2024    6:25 PM 06/10/2024    4:22 AM  CBC  Hemoglobin 12.0 - 15.0 g/dL 8.0  8.0  7.6   Hematocrit 36.0 - 46.0 % 25.3  25.4  23.4       Latest Ref Rng & Units 06/08/2024    4:33 AM 06/07/2024   12:49 PM 06/07/2024   12:40 PM  BMP  Glucose 70 - 99 mg/dL 893  798  791   BUN 8 - 23 mg/dL 29  27  25    Creatinine 0.44 - 1.00 mg/dL 8.96  8.99  8.99   Sodium 135 - 145 mmol/L 134  134  132   Potassium 3.5 - 5.1 mmol/L 3.9  4.5  4.6   Chloride 98 - 111 mmol/L 100  94  98   CO2 22 - 32 mmol/L 27   22   Calcium 8.9 - 10.3 mg/dL 8.0   8.6    No results found.   Family Communication: discussed with patient, daughter at bedside. They understand and agree.  Disposition: Status is: Inpatient Remains inpatient appropriate because: SNF placement  Planned Discharge Destination: Skilled nursing facility     Time spent: 40 minutes  Author: Concepcion Riser, MD 06/11/2024 3:59 PM Secure chat 7am to 7pm For on call review [email or web address].

## 2024-06-12 DIAGNOSIS — D62 Acute posthemorrhagic anemia: Secondary | ICD-10-CM | POA: Insufficient documentation

## 2024-06-12 DIAGNOSIS — S20212A Contusion of left front wall of thorax, initial encounter: Secondary | ICD-10-CM | POA: Insufficient documentation

## 2024-06-12 DIAGNOSIS — I10 Essential (primary) hypertension: Secondary | ICD-10-CM

## 2024-06-12 DIAGNOSIS — M6282 Rhabdomyolysis: Secondary | ICD-10-CM | POA: Insufficient documentation

## 2024-06-12 DIAGNOSIS — E038 Other specified hypothyroidism: Secondary | ICD-10-CM

## 2024-06-12 DIAGNOSIS — I4891 Unspecified atrial fibrillation: Secondary | ICD-10-CM

## 2024-06-12 MED ORDER — MULTI-VITAMIN/MINERALS PO TABS: 1.0000 | ORAL_TABLET | Freq: Every day | ORAL | 2 refills | Status: AC

## 2024-06-12 MED ORDER — BISOPROLOL FUMARATE 5 MG PO TABS: 5.0000 mg | ORAL_TABLET | Freq: Every day | ORAL | 2 refills | Status: AC

## 2024-06-12 MED ORDER — LORAZEPAM 0.5 MG PO TABS: 0.5000 mg | ORAL_TABLET | Freq: Every day | ORAL | 0 refills | Status: AC | PRN

## 2024-06-12 NOTE — TOC Progression Note (Signed)
 Transition of Care [facility]) - Progression Note    Patient Details  Name: ALDONIA KEEVEN MRN: [id number] Date of Birth: 02-Apr-1925  [facility] CM/SW Contact  Sherline Clack, [place] Phone Number: 06/12/2024, 8:59 AM  Clinical Narrative:     CSW reached out to [facility] to verify facility has a bed for patient today. [facility] confirmed patient is able to transfer to facility if medically ready. CSW will update provider.   Expected Discharge Plan: Skilled Nursing Facility Barriers to Discharge: Insurance Authorization, SNF Pending bed offer               Expected Discharge Plan and Services       Living arrangements for the past 2 months: Single Family Home                                       Social Drivers of Health (SDOH) Interventions SDOH Screenings   Food Insecurity: No Food Insecurity (06/07/2024)  Housing: Low Risk  (06/07/2024)  Transportation Needs: No Transportation Needs (06/07/2024)  Utilities: Not At Risk (06/07/2024)  Social Connections: Socially Isolated (06/07/2024)  Tobacco Use: Low Risk  (06/07/2024)    Readmission Risk Interventions     No data to display

## 2024-06-12 NOTE — Plan of Care (Signed)

## 2024-06-12 NOTE — TOC Transition Note (Signed)
 Transition of Care [facility]) - Discharge Note   Patient Details  Name: Lynn Alvarado MRN: [id number] Date of Birth: 1924/12/16  Transition of Care [facility]) CM/SW Contact:  Sherline Clack, LCSWA Phone Number: 06/12/2024, 10:57 AM   Clinical Narrative:     Patient will DC to: [facility] Anticipated DC date: 06/12/24  Family notified: daughter/Sandy Transport by: [phone]   Per MD patient ready for DC to [facility]. RN to call report prior to discharge [phone], [phone] ). RN, patient, patient's family, and facility notified of DC. Discharge Summary and FL2 sent to facility. DC packet on chart. Ambulance transport requested for patient.   CSW will sign off for now as social work intervention is no longer needed. Please consult us  again if new needs arise.    Final next level of care: Skilled Nursing Facility Barriers to Discharge: Barriers Resolved   Patient Goals and CMS Choice            Discharge Placement              Patient chooses bed at: [facility] Patient to be transferred to facility by: [facility] Name of family member notified: Sandy/daughter: [phone] Patient and family notified of of transfer: 06/12/24  Discharge Plan and Services Additional resources added to the After Visit Summary for                                       Social Drivers of Health (SDOH) Interventions SDOH Screenings   Food Insecurity: No Food Insecurity (06/07/2024)  Housing: Low Risk  (06/07/2024)  Transportation Needs: No Transportation Needs (06/07/2024)  Utilities: Not At Risk (06/07/2024)  Social Connections: Socially Isolated (06/07/2024)  Tobacco Use: Low Risk  (06/07/2024)     Readmission Risk Interventions     No data to display

## 2024-06-12 NOTE — Progress Notes (Signed)
 Physical Therapy Treatment Patient Details Name: Lynn Alvarado MRN: [id number] DOB: 03/22/1925 Today's Date: 06/12/2024   History of Present Illness 89 yo s/p fall in bathtub with bruising L chest  with mild rhabdomyolosis CT (+) hematoma on L breast and soft tissue no bony injury PMH anxiety disroder, afib with past RVR on eliquis , dyslipidemia, HTN, chronic peripheral edema    PT Comments  Pt presents semi-reclined and agreeable to therapy.  Pt transfers to EOB w/ CGA only from almost 90 degree sitting position.  Pt able to scoot forward.  Pt amb x 30' including turns to return to bed and recliner.  Pt agreeable to amb w/o AD and required min A, although pt reaches for external support.  Pt remained sitting in recliner w/ seat alarm on and all needs in reach, daughter present.    If plan is discharge home, recommend the following: A little help with walking and/or transfers;A lot of help with bathing/dressing/bathroom;Assist for transportation;Assistance with cooking/housework;Help with stairs or ramp for entrance   Can travel by private vehicle        Equipment Recommendations       Recommendations for Other Services       Precautions / Restrictions       Mobility  Bed Mobility Overal bed mobility: Needs Assistance Bed Mobility: Supine to Sit     Supine to sit: Contact guard, HOB elevated          Transfers Overall transfer level: Needs assistance Equipment used: Rolling walker (2 wheels) Transfers: Sit to/from Stand Sit to Stand: Min assist           General transfer comment: STS from bed and recliner w/ min/CGA and cues for hand placement.  Pt reaches for RW in front.    Ambulation/Gait Ambulation/Gait assistance: Min assist Gait Distance (Feet): 30 Feet Assistive device: Rolling walker (2 wheels) Gait Pattern/deviations: Step-through pattern, Decreased stride length, Trunk flexed       General Gait Details: Pt pushes RW forward especially w/in confined  space and turning, cues to maintain position w/in RW.    Tilt Bed    Modified Rankin (Stroke Patients Only)       Balance Overall balance assessment: Needs assistance Sitting-balance support: No upper extremity supported, Feet supported Sitting balance-Leahy Scale: Good     Standing balance support: During functional activity, Bilateral upper extremity supported, Reliant on assistive device for balance (1 gait trial w/o AD x 30', w/ min A and cues for posture, does reach for external support.)                                Communication Communication Factors Affecting Communication: Hearing impaired  Cognition Arousal: Alert Behavior During Therapy: WFL for tasks assessed/performed   PT - Cognitive impairments: Awareness, Memory, Problem solving, Safety/Judgement                                Cueing  Cues for hand placement for sit to stand, otherwise would reach for RW.  Exercises      General Comments        Pertinent Vitals/Pain Pain Assessment Pain Assessment: No/denies pain Faces Pain Scale: No hurt    Home Living                          Prior Function  PT Goals (current goals can now be found in the care plan section) Acute Rehab PT Goals Patient Stated Goal: to go home Time For Goal Achievement: 06/22/24 Potential to Achieve Goals: Fair Progress towards PT goals: Progressing toward goals    Frequency           PT Plan      Co-evaluation              AM-PAC PT 6 Clicks Mobility   Outcome Measure  Help needed turning from your back to your side while in a flat bed without using bedrails?: A Little Help needed moving from lying on your back to sitting on the side of a flat bed without using bedrails?: A Little Help needed moving to and from a bed to a chair (including a wheelchair)?: A Little Help needed standing up from a chair using your arms (e.g., wheelchair or bedside chair)?: A  Little Help needed to walk in hospital room?: A Little Help needed climbing 3-5 steps with a railing? : Total 6 Click Score: 16    End of Session Equipment Utilized During Treatment: Gait belt   Patient left: with call bell/phone within reach;in chair;with chair alarm set;with family/visitor present Nurse Communication: Mobility status PT Visit Diagnosis: Other abnormalities of gait and mobility (R26.89);History of falling (Z91.81)     Time: 9098-9080 PT Time Calculation (min) (ACUTE ONLY): 18 min  Charges:    $Gait Training: 8-22 mins PT General Charges $$ ACUTE PT VISIT: 1 Visit                     Reyes MYRTIS Sierra, PT    Reyes SHAUNNA Sierra 06/12/2024, 10:08 AM

## 2024-06-12 NOTE — Discharge Summary (Addendum)
 Physician Discharge Summary   Patient: Lynn Alvarado MRN: [id number] DOB: 10-25-1924  Admit date:     06/07/2024  Discharge date: 06/12/24  Discharge Physician: Concepcion Riser   PCP: Loreli Kins, MD   Recommendations at discharge:    PCP follow up in 1 week.  Discharge Diagnoses: Principal Problem:   Unwitnessed fall Active Problems:   Atrial fibrillation with rapid ventricular response (HCC)   Essential hypertension   Hypothyroidism   Acute blood loss anemia   Rhabdomyolysis   Chest wall hematoma, left, initial encounter  Resolved Problems:   * No resolved hospital problems. *  Hospital Course: Lynn Alvarado is a 89 y.o. female with medical history significant of anxiety disorder, atrial fibrillation with past episode of RVR diagnosed in 2019 on Eliquis , dyslipidemia, hypertension and chronic peripheral edema was found down in bathtub with significant bruising to the bilateral chest more so on the left. Patient has no recollection of falling or being in the tub. CT of the chest though did reveal a large hematoma in the left breast and chest wall soft tissues measuring 15.9 x 6 x 12.4 cm with an associated contusion that was demonstrated throughout the left breast and lateral chest wall subcutaneous tissues. Trauma service evaluated the patient and determined that current condition was self-limiting and no indication for trauma service related admission. Hospital service has been asked to evaluate the patient for admission.    She had chest wall trauma, low Hb but stable. Eliquis  stopped per daughter request. Discussed with daughter, patient wished for DNR order updated. PT/ OT suggested SNF, she is hemodynamically stable to be discharged to SNF.   Assessment and Plan: Unwitnessed fall at home Patient fell at home but does not remember falling, history of prior mechanical falls with 4 documented (including this fall) in the past 6 months. Suspect possible degree of  hypovolemia, dehydration, debility. PT and OT advised SNF. She is hemodynamically stable to be discharged to [facility] place today.   Large left breast/ left chest wall hematoma and contusion Acute blood loss anemia Secondary to fall and Eliquis  contributing to blood loss. Appreciate assistance of trauma service.  Has very large hematoma with contusion as outlined in imaging with a likely self-limiting process. Baseline Hb 14-15 dropped to 7.6 stable at 8.0 since last 3 days. Discontinued Eliquis  per daughter request. No need for further Hb check. Continue iron supplements.   Mild rhabdomyolosis CK only mildly elevated, trended down. Stopped gentle IV fluids. She is eating fair.   Chronic atrial fibrillation currently maintaining sinus rhythm- Currently rate controlled and maintaining sinus rhythm with some PACs On bisoprolol  prior to admission which is continued. Discontinued Eliquis  given risks outweigh benefits after discussing with daughter.   Hyperglycemia with mild hyponatremia A1c 6.0.  Advised carb consistent diet.  Hyponatremia- Stable. Hold hydrochlorothiazide .   Hypertension Hold hydrochlorothiazide , continue bisoprolol , lasix  PRN.   Dyslipidemia Continue Pravachol    Hypothyroidism Continue Synthroid    Anxiety Continue Zoloft    Consultants: none Procedures performed: none  Disposition: Skilled nursing facility Diet recommendation:  Discharge Diet Orders (From admission, onward)     Start     Ordered   06/12/24 0000  Diet - low sodium heart healthy        06/12/24 1010           Cardiac and Carb modified diet DISCHARGE MEDICATION: Allergies as of 06/12/2024   No Known Allergies      Medication List     STOP taking these  medications    bisoprolol -hydrochlorothiazide  5-6.25 MG tablet Commonly known as: ZIAC    Eliquis  5 MG Tabs tablet Generic drug: apixaban        TAKE these medications    bisoprolol  5 MG tablet Commonly known as:  ZEBETA  Take 1 tablet (5 mg total) by mouth daily.   furosemide  20 MG tablet Commonly known as: LASIX  Take 20 mg by mouth daily as needed for fluid or edema.   levothyroxine  50 MCG tablet Commonly known as: SYNTHROID  Take 50 mcg by mouth daily before breakfast.   LORazepam  0.5 MG tablet Commonly known as: ATIVAN  Take 1 tablet (0.5 mg total) by mouth daily as needed for anxiety. What changed:  medication strength how much to take   multivitamin with minerals tablet Take 1 tablet by mouth daily.   pravastatin  40 MG tablet Commonly known as: PRAVACHOL  Take 40 mg by mouth at bedtime.   sertraline  50 MG tablet Commonly known as: ZOLOFT  Take 50 mg by mouth at bedtime.        Contact information for after-discharge care     Destination     [facility] .   Service: Skilled Nursing Contact information: [address] [place]  [address] [phone]                    Discharge Exam: Fredricka Weights   06/07/24 1303  Weight: 60 kg      06/12/2024    8:13 AM 06/12/2024    4:10 AM 06/11/2024   11:56 PM  Vitals with BMI  Systolic 146 156 893  Diastolic 68 55 50  Pulse 70 68 63    General - Elderly Caucasian female, sitting, no apparent distress.  HEENT - PERRLA, EOMI, chest wall bruise noted Lung - Clear, basal rales, no rhonchi, wheezes. Heart - S1, S2 heard, no murmurs, rubs, trace pedal edema. Abdomen - Soft, non tender, bowel sounds good Neuro - Alert, awake and oriented, non focal exam. Skin - Warm and dry.  Condition at discharge: stable  The results of significant diagnostics from this hospitalization (including imaging, microbiology, ancillary and laboratory) are listed below for reference.   Imaging Studies: CT L-SPINE NO CHARGE Result Date: 06/07/2024 EXAM: CT THORACIC AND LUMBAR SPINE WITH INTRAVENOUS CONTRAST 06/07/2024 01:17:08 PM TECHNIQUE: CT of the thoracic and lumbar spine was performed  with the administration of 75 mL of iohexol  (OMNIPAQUE ) 350 MG/ML injection. Multiplanar reformatted images are provided for review. Automated exposure control, iterative reconstruction, and/or weight based adjustment of the mA/kV was utilized to reduce the radiation dose to as low as reasonably achievable. Incidental adrenal and/or renal findings do not require follow up imaging. COMPARISON: None available. CLINICAL HISTORY: FINDINGS: THORACIC SPINE: BONES AND ALIGNMENT: Vertebral body height loss. No acute fracture or suspicious bone lesion. Normal alignment. DEGENERATIVE CHANGES: Mild multilevel degeneration. No spinal canal stenosis. SOFT TISSUES: No acute abnormality. LUMBAR SPINE: BONES AND ALIGNMENT: Vertebral body height loss. No acute fracture or suspicious bone lesion. Normal alignment. DEGENERATIVE CHANGES: Mild multilevel degeneration. No spinal canal stenosis. SOFT TISSUES: No acute abnormality. IMPRESSION: 1. No acute fracture or traumatic malalignment of the thoracic or lumbar spine. 2. No spinal canal stenosis. Electronically signed by: Franky Stanford MD 06/07/2024 01:46 PM EST RP Workstation: [id number]   CT T-SPINE NO CHARGE Result Date: 06/07/2024 EXAM: CT THORACIC AND LUMBAR SPINE WITH INTRAVENOUS CONTRAST 06/07/2024 01:17:08 PM TECHNIQUE: CT of the thoracic and lumbar spine was performed with the administration of 75 mL  of iohexol  (OMNIPAQUE ) 350 MG/ML injection. Multiplanar reformatted images are provided for review. Automated exposure control, iterative reconstruction, and/or weight based adjustment of the mA/kV was utilized to reduce the radiation dose to as low as reasonably achievable. Incidental adrenal and/or renal findings do not require follow up imaging. COMPARISON: None available. CLINICAL HISTORY: FINDINGS: THORACIC SPINE: BONES AND ALIGNMENT: Vertebral body height loss. No acute fracture or suspicious bone lesion. Normal alignment. DEGENERATIVE CHANGES: Mild multilevel  degeneration. No spinal canal stenosis. SOFT TISSUES: No acute abnormality. LUMBAR SPINE: BONES AND ALIGNMENT: Vertebral body height loss. No acute fracture or suspicious bone lesion. Normal alignment. DEGENERATIVE CHANGES: Mild multilevel degeneration. No spinal canal stenosis. SOFT TISSUES: No acute abnormality. IMPRESSION: 1. No acute fracture or traumatic malalignment of the thoracic or lumbar spine. 2. No spinal canal stenosis. Electronically signed by: Franky Stanford MD 06/07/2024 01:46 PM EST RP Workstation: [id number]   CT CHEST ABDOMEN PELVIS W CONTRAST Result Date: 06/07/2024 CLINICAL DATA:  Fall. Blunt poly trauma. On anticoagulation. Chest and abdominal pain. EXAM: CT CHEST, ABDOMEN, AND PELVIS WITH CONTRAST TECHNIQUE: Multidetector CT imaging of the chest, abdomen and pelvis was performed following the standard protocol during bolus administration of intravenous contrast. RADIATION DOSE REDUCTION: This exam was performed according to the departmental dose-optimization program which includes automated exposure control, adjustment of the mA and/or kV according to patient size and/or use of iterative reconstruction technique. CONTRAST:  75mL OMNIPAQUE  IOHEXOL  350 MG/ML SOLN COMPARISON:  Chest CT on 03/20/2018, and AP CT on 03/27/2018 FINDINGS: CT CHEST FINDINGS Cardiovascular: No evidence of thoracic aortic injury or mediastinal hematoma. No pericardial effusion. Mild cardiomegaly. Aortic and coronary atherosclerotic calcification noted. Mediastinum/Nodes: No evidence of hemorrhage or pneumomediastinum. No pathologically enlarged lymph nodes identified. 1.9 cm right thyroid  lobe nodule is seen, but unchanged compared to prior exam. Lungs/Pleura: No evidence of pulmonary contusion or other infiltrate. No evidence of pneumothorax or hemothorax. Musculoskeletal: Large hematoma is seen in the left breast and chest wall soft tissues measuring approximately 15.9 x 6.0 x 12.4 cm. Contusion is also seen  throughout the left breast and lateral chest wall subcutaneous tissues. No acute fractures or suspicious bone lesions identified. CT ABDOMEN PELVIS FINDINGS Hepatobiliary: No hepatic laceration identified. A small cyst with peripheral calcification in the inferior right hepatic lobe measuring 1.6 cm is stable, consistent with benign etiology. No other liver lesions identified. Prior cholecystectomy. No evidence of biliary obstruction. Pancreas: No parenchymal laceration, mass, or inflammatory changes identified. Spleen: No evidence of splenic laceration. Adrenal/Urinary Tract: No hemorrhage or parenchymal lacerations identified. No evidence of suspicious masses or hydronephrosis. Stomach/Bowel: Unopacified bowel loops are unremarkable in appearance. No evidence of hemoperitoneum. Diverticulosis is seen mainly involving the sigmoid colon, however there is no evidence of diverticulitis. Vascular/Lymphatic: No evidence of abdominal aortic injury or retroperitoneal hemorrhage. No pathologically enlarged lymph nodes identified. Reproductive: Prior hysterectomy noted. Adnexal regions are unremarkable in appearance. Other:  None. Musculoskeletal: No acute fractures or suspicious bone lesions identified. IMPRESSION: Large hematoma and subcutaneous soft tissue contusion throughout the left breast and chest wall soft tissues. No evidence of acute fracture, pneumothorax or hemothorax. No No evidence of traumatic injury within the abdomen or pelvis. Colonic diverticulosis, without radiographic evidence of diverticulitis. Stable 1.9 cm right thyroid  lobe nodule. Consider further evaluation with thyroid  ultrasound. (ref: J Am Coll Radiol. 2015 Feb;12(2): 143-50). Electronically Signed   By: Norleen DELENA Kil M.D.   On: 06/07/2024 13:45   CT HEAD WO CONTRAST Result Date: 06/07/2024 CLINICAL DATA:  Head and  neck trauma. Fall. Patient on blood thinners. EXAM: CT HEAD WITHOUT CONTRAST CT CERVICAL SPINE WITHOUT CONTRAST TECHNIQUE:  Multidetector CT imaging of the head and cervical spine was performed following the standard protocol without intravenous contrast. Multiplanar CT image reconstructions of the cervical spine were also generated. RADIATION DOSE REDUCTION: This exam was performed according to the departmental dose-optimization program which includes automated exposure control, adjustment of the mA and/or kV according to patient size and/or use of iterative reconstruction technique. COMPARISON:  03/20/2018 FINDINGS: CT HEAD FINDINGS Brain: Ventricles, cisterns and other CSF spaces are within normal for patient's age as there is mild age related atrophic change present. There is mild chronic ischemic microvascular disease. There is no mass, mass effect, shift of midline structures or acute hemorrhage. No evidence of acute infarction. Vascular: No hyperdense vessel or unexpected calcification. Skull: Normal. Negative for fracture or focal lesion. Sinuses/Orbits: Orbits are normal symmetric. Paranasal sinuses are well developed and demonstrate moderate opacification over the left maxillary sinus and sphenoid sinus with associated mucoperiosteal thickening compatible with chronic inflammatory change. Mastoid air cells are clear. Other: None. CT CERVICAL SPINE FINDINGS Alignment: Normal. Skull base and vertebrae: There is mild to moderate spondylosis throughout the cervical spine to include uncovertebral joint spurring and facet arthropathy. No acute fracture. Moderate right-sided neural foraminal narrowing at the C3-4 level and minimal left-sided neural foraminal narrowing at this level. Mild-to-moderate bilateral neural foraminal narrowing at the C4-5 level left worse than right. Mild bilateral neural from narrowing at the C5-6 level right worse than left and mild bilateral neural foraminal narrowing at the C6-7 level left worse than right. Soft tissues and spinal canal: No prevertebral fluid or swelling. No visible canal hematoma. Disc  levels: Disc space narrowing from the C3-4 level to the C6-7 level. Upper chest: No acute findings. Other: None. IMPRESSION: 1. No acute brain injury. 2. Mild age related atrophic change and chronic ischemic microvascular disease. 3. No acute cervical spine injury. 4. Mild to moderate spondylosis throughout the cervical spine with multilevel disc disease and neural foraminal narrowing as described. Electronically Signed   By: Toribio Agreste M.D.   On: 06/07/2024 13:26   CT CERVICAL SPINE WO CONTRAST Result Date: 06/07/2024 CLINICAL DATA:  Head and neck trauma. Fall. Patient on blood thinners. EXAM: CT HEAD WITHOUT CONTRAST CT CERVICAL SPINE WITHOUT CONTRAST TECHNIQUE: Multidetector CT imaging of the head and cervical spine was performed following the standard protocol without intravenous contrast. Multiplanar CT image reconstructions of the cervical spine were also generated. RADIATION DOSE REDUCTION: This exam was performed according to the departmental dose-optimization program which includes automated exposure control, adjustment of the mA and/or kV according to patient size and/or use of iterative reconstruction technique. COMPARISON:  03/20/2018 FINDINGS: CT HEAD FINDINGS Brain: Ventricles, cisterns and other CSF spaces are within normal for patient's age as there is mild age related atrophic change present. There is mild chronic ischemic microvascular disease. There is no mass, mass effect, shift of midline structures or acute hemorrhage. No evidence of acute infarction. Vascular: No hyperdense vessel or unexpected calcification. Skull: Normal. Negative for fracture or focal lesion. Sinuses/Orbits: Orbits are normal symmetric. Paranasal sinuses are well developed and demonstrate moderate opacification over the left maxillary sinus and sphenoid sinus with associated mucoperiosteal thickening compatible with chronic inflammatory change. Mastoid air cells are clear. Other: None. CT CERVICAL SPINE FINDINGS  Alignment: Normal. Skull base and vertebrae: There is mild to moderate spondylosis throughout the cervical spine to include uncovertebral joint spurring and facet  arthropathy. No acute fracture. Moderate right-sided neural foraminal narrowing at the C3-4 level and minimal left-sided neural foraminal narrowing at this level. Mild-to-moderate bilateral neural foraminal narrowing at the C4-5 level left worse than right. Mild bilateral neural from narrowing at the C5-6 level right worse than left and mild bilateral neural foraminal narrowing at the C6-7 level left worse than right. Soft tissues and spinal canal: No prevertebral fluid or swelling. No visible canal hematoma. Disc levels: Disc space narrowing from the C3-4 level to the C6-7 level. Upper chest: No acute findings. Other: None. IMPRESSION: 1. No acute brain injury. 2. Mild age related atrophic change and chronic ischemic microvascular disease. 3. No acute cervical spine injury. 4. Mild to moderate spondylosis throughout the cervical spine with multilevel disc disease and neural foraminal narrowing as described. Electronically Signed   By: Toribio Agreste M.D.   On: 06/07/2024 13:26   DG Humerus Left Result Date: 06/07/2024 CLINICAL DATA:  Fall yesterday with left arm pain. EXAM: LEFT HUMERUS - 2+ VIEW COMPARISON:  None Available. FINDINGS: Only single view submitted for review as two view was ordered. Provider was at bedside and stated only one view was needed. No evidence of acute fracture or dislocation. Bone alignment and mineralization is normal. IMPRESSION: No acute findings. Electronically Signed   By: Toribio Agreste M.D.   On: 06/07/2024 13:13   DG Chest Port 1 View Result Date: 06/07/2024 CLINICAL DATA:  Fall as patient on blood thinners. EXAM: PORTABLE CHEST 1 VIEW COMPARISON:  03/27/2018 FINDINGS: Lungs are adequately inflated and otherwise clear. Cardiomediastinal silhouette is normal. No acute fracture. IMPRESSION: No active disease.  Electronically Signed   By: Toribio Agreste M.D.   On: 06/07/2024 13:10    Microbiology: Results for orders placed or performed during the hospital encounter of 04/25/20  SARS CORONAVIRUS 2 (TAT 6-24 HRS) Nasopharyngeal Nasopharyngeal Swab     Status: None   Collection Time: 04/25/20 10:14 AM   Specimen: Nasopharyngeal Swab  Result Value Ref Range Status   SARS Coronavirus 2 NEGATIVE NEGATIVE Final    Comment: (NOTE) SARS-CoV-2 target nucleic acids are NOT DETECTED.  The SARS-CoV-2 RNA is generally detectable in upper and lower respiratory specimens during the acute phase of infection. Negative results do not preclude SARS-CoV-2 infection, do not rule out co-infections with other pathogens, and should not be used as the sole basis for treatment or other patient management decisions. Negative results must be combined with clinical observations, patient history, and epidemiological information. The expected result is Negative.  Fact Sheet for Patients: hairslick.no  Fact Sheet for Healthcare Providers: quierodirigir.com  This test is not yet approved or cleared by the [place]  FDA and  has been authorized for detection and/or diagnosis of SARS-CoV-2 by FDA under an Emergency Use Authorization (EUA). This EUA will remain  in effect (meaning this test can be used) for the duration of the COVID-19 declaration under Se ction 564(b)(1) of the Act, 21 U.S.C. section 360bbb-3(b)(1), unless the authorization is terminated or revoked sooner.  Performed at [facility], [address] [place], [place] [address]     Labs: CBC: Recent Labs  Lab 06/07/24 1240 06/07/24 1249 06/08/24 0433 06/08/24 1750 06/09/24 0554 06/09/24 1826 06/10/24 0422 06/10/24 1825 06/11/24 0458  WBC 8.6  --  6.9  --   --   --   --   --   --   HGB 10.5*   < > 7.9*   < > 7.7* 7.4* 7.6* 8.0*  8.0*  HCT 33.4*   < > 24.7*   < > 23.5* 23.5* 23.4*  25.4* 25.3*  MCV 91.5  --  89.5  --   --   --   --   --   --   PLT 175  --  143*  --   --   --   --   --   --    < > = values in this interval not displayed.   Basic Metabolic Panel: Recent Labs  Lab 06/07/24 1240 06/07/24 1249 06/08/24 0433  NA 132* 134* 134*  K 4.6 4.5 3.9  CL 98 94* 100  CO2 22  --  27  GLUCOSE 208* 201* 106*  BUN 25* 27* 29*  CREATININE 1.00 1.00 1.03*  CALCIUM 8.6*  --  8.0*   Liver Function Tests: Recent Labs  Lab 06/07/24 1240 06/08/24 0433  AST 46* 37  ALT 20 17  ALKPHOS 74 58  BILITOT 1.9* 1.3*  PROT 6.2* 4.7*  ALBUMIN 2.9* 2.2*   CBG: No results for input(s): GLUCAP in the last 168 hours.  Discharge time spent: 36 minutes.  Signed: Concepcion Riser, MD [facility] 06/12/2024

## 2024-06-16 DIAGNOSIS — I482 Chronic atrial fibrillation, unspecified: Secondary | ICD-10-CM | POA: Diagnosis not present

## 2024-06-16 DIAGNOSIS — R2681 Unsteadiness on feet: Secondary | ICD-10-CM | POA: Diagnosis not present

## 2024-06-16 DIAGNOSIS — M6282 Rhabdomyolysis: Secondary | ICD-10-CM | POA: Diagnosis not present

## 2024-06-16 DIAGNOSIS — Z9181 History of falling: Secondary | ICD-10-CM | POA: Diagnosis not present

## 2024-06-16 DIAGNOSIS — M6259 Muscle wasting and atrophy, not elsewhere classified, multiple sites: Secondary | ICD-10-CM | POA: Diagnosis not present

## 2024-06-16 DIAGNOSIS — M6281 Muscle weakness (generalized): Secondary | ICD-10-CM | POA: Diagnosis not present

## 2024-06-16 DIAGNOSIS — R609 Edema, unspecified: Secondary | ICD-10-CM | POA: Diagnosis not present

## 2024-06-19 DIAGNOSIS — M6281 Muscle weakness (generalized): Secondary | ICD-10-CM | POA: Diagnosis not present

## 2024-06-19 DIAGNOSIS — M6259 Muscle wasting and atrophy, not elsewhere classified, multiple sites: Secondary | ICD-10-CM | POA: Diagnosis not present

## 2024-06-19 DIAGNOSIS — R609 Edema, unspecified: Secondary | ICD-10-CM | POA: Diagnosis not present

## 2024-06-19 DIAGNOSIS — Z9181 History of falling: Secondary | ICD-10-CM | POA: Diagnosis not present

## 2024-06-19 DIAGNOSIS — I482 Chronic atrial fibrillation, unspecified: Secondary | ICD-10-CM | POA: Diagnosis not present

## 2024-06-19 DIAGNOSIS — M6282 Rhabdomyolysis: Secondary | ICD-10-CM | POA: Diagnosis not present

## 2024-06-19 DIAGNOSIS — R2681 Unsteadiness on feet: Secondary | ICD-10-CM | POA: Diagnosis not present

## 2024-06-23 DIAGNOSIS — R2681 Unsteadiness on feet: Secondary | ICD-10-CM | POA: Diagnosis not present

## 2024-06-23 DIAGNOSIS — R609 Edema, unspecified: Secondary | ICD-10-CM | POA: Diagnosis not present

## 2024-06-23 DIAGNOSIS — Z9181 History of falling: Secondary | ICD-10-CM | POA: Diagnosis not present

## 2024-06-23 DIAGNOSIS — I482 Chronic atrial fibrillation, unspecified: Secondary | ICD-10-CM | POA: Diagnosis not present

## 2024-06-23 DIAGNOSIS — M6259 Muscle wasting and atrophy, not elsewhere classified, multiple sites: Secondary | ICD-10-CM | POA: Diagnosis not present

## 2024-06-23 DIAGNOSIS — M6281 Muscle weakness (generalized): Secondary | ICD-10-CM | POA: Diagnosis not present

## 2024-06-23 DIAGNOSIS — M6282 Rhabdomyolysis: Secondary | ICD-10-CM | POA: Diagnosis not present
